# Patient Record
Sex: Female | Born: 1935 | Race: White | Hispanic: No | Smoking: Never smoker
Health system: Southern US, Community
[De-identification: ages and names within clinical notes are randomized; demographics above are authoritative.]

## PROBLEM LIST (undated history)

## (undated) DIAGNOSIS — E119 Type 2 diabetes mellitus without complications: Secondary | ICD-10-CM

## (undated) DIAGNOSIS — Z9289 Personal history of other medical treatment: Secondary | ICD-10-CM

## (undated) DIAGNOSIS — I10 Essential (primary) hypertension: Secondary | ICD-10-CM

## (undated) DIAGNOSIS — I214 Non-ST elevation (NSTEMI) myocardial infarction: Secondary | ICD-10-CM

## (undated) DIAGNOSIS — K861 Other chronic pancreatitis: Secondary | ICD-10-CM

## (undated) DIAGNOSIS — D649 Anemia, unspecified: Secondary | ICD-10-CM

## (undated) DIAGNOSIS — D0471 Carcinoma in situ of skin of right lower limb, including hip: Secondary | ICD-10-CM

## (undated) DIAGNOSIS — E785 Hyperlipidemia, unspecified: Secondary | ICD-10-CM

## (undated) DIAGNOSIS — M810 Age-related osteoporosis without current pathological fracture: Secondary | ICD-10-CM

## (undated) DIAGNOSIS — I739 Peripheral vascular disease, unspecified: Secondary | ICD-10-CM

## (undated) DIAGNOSIS — N200 Calculus of kidney: Secondary | ICD-10-CM

## (undated) DIAGNOSIS — E039 Hypothyroidism, unspecified: Secondary | ICD-10-CM

## (undated) DIAGNOSIS — I4891 Unspecified atrial fibrillation: Secondary | ICD-10-CM

## (undated) HISTORY — DX: Other chronic pancreatitis: K86.1

## (undated) HISTORY — DX: Peripheral vascular disease, unspecified: I73.9

## (undated) HISTORY — DX: Essential (primary) hypertension: I10

## (undated) HISTORY — DX: Personal history of other medical treatment: Z92.89

## (undated) HISTORY — DX: Type 2 diabetes mellitus without complications: E11.9

## (undated) HISTORY — DX: Unspecified atrial fibrillation: I48.91

## (undated) HISTORY — PX: APPENDECTOMY: SHX54

## (undated) HISTORY — PX: TOTAL HIP ARTHROPLASTY: SHX124

## (undated) HISTORY — DX: Hyperlipidemia, unspecified: E78.5

## (undated) HISTORY — DX: Hypothyroidism, unspecified: E03.9

## (undated) HISTORY — DX: Calculus of kidney: N20.0

## (undated) HISTORY — DX: Age-related osteoporosis without current pathological fracture: M81.0

## (undated) HISTORY — DX: Anemia, unspecified: D64.9

## (undated) HISTORY — PX: ABDOMINAL HYSTERECTOMY: SHX81

---

## 2001-05-23 ENCOUNTER — Encounter: Payer: Self-pay | Admitting: Specialist

## 2001-05-23 ENCOUNTER — Inpatient Hospital Stay (HOSPITAL_COMMUNITY): Admission: EM | Admit: 2001-05-23 | Discharge: 2001-05-26 | Payer: Self-pay | Admitting: Emergency Medicine

## 2001-05-23 ENCOUNTER — Encounter: Payer: Self-pay | Admitting: Emergency Medicine

## 2001-05-26 ENCOUNTER — Inpatient Hospital Stay
Admission: RE | Admit: 2001-05-26 | Discharge: 2001-06-10 | Payer: Self-pay | Admitting: Physical Medicine & Rehabilitation

## 2001-10-26 ENCOUNTER — Encounter: Payer: Self-pay | Admitting: Specialist

## 2001-10-26 ENCOUNTER — Encounter: Admission: RE | Admit: 2001-10-26 | Discharge: 2001-10-26 | Payer: Self-pay | Admitting: Specialist

## 2002-01-27 ENCOUNTER — Encounter: Admission: RE | Admit: 2002-01-27 | Discharge: 2002-01-27 | Payer: Self-pay | Admitting: Specialist

## 2002-01-27 ENCOUNTER — Encounter: Payer: Self-pay | Admitting: Specialist

## 2002-01-28 ENCOUNTER — Emergency Department (HOSPITAL_COMMUNITY): Admission: EM | Admit: 2002-01-28 | Discharge: 2002-01-28 | Payer: Self-pay | Admitting: Emergency Medicine

## 2002-02-22 ENCOUNTER — Encounter: Admission: RE | Admit: 2002-02-22 | Discharge: 2002-02-22 | Payer: Self-pay | Admitting: Urology

## 2002-02-22 ENCOUNTER — Encounter: Payer: Self-pay | Admitting: Urology

## 2004-04-23 ENCOUNTER — Ambulatory Visit (HOSPITAL_COMMUNITY): Admission: RE | Admit: 2004-04-23 | Discharge: 2004-04-23 | Payer: Self-pay | Admitting: Urology

## 2004-04-23 ENCOUNTER — Ambulatory Visit (HOSPITAL_BASED_OUTPATIENT_CLINIC_OR_DEPARTMENT_OTHER): Admission: RE | Admit: 2004-04-23 | Discharge: 2004-04-23 | Payer: Self-pay | Admitting: Urology

## 2004-12-27 ENCOUNTER — Emergency Department (HOSPITAL_COMMUNITY): Admission: EM | Admit: 2004-12-27 | Discharge: 2004-12-27 | Payer: Self-pay

## 2005-01-07 ENCOUNTER — Ambulatory Visit: Payer: Self-pay | Admitting: Cardiology

## 2005-01-08 ENCOUNTER — Emergency Department (HOSPITAL_COMMUNITY): Admission: EM | Admit: 2005-01-08 | Discharge: 2005-01-08 | Payer: Self-pay | Admitting: Emergency Medicine

## 2005-01-16 ENCOUNTER — Ambulatory Visit: Payer: Self-pay

## 2005-01-16 ENCOUNTER — Ambulatory Visit: Payer: Self-pay | Admitting: Cardiovascular Disease

## 2005-03-03 ENCOUNTER — Ambulatory Visit: Payer: Self-pay | Admitting: Cardiology

## 2005-09-08 ENCOUNTER — Ambulatory Visit: Payer: Self-pay | Admitting: Cardiology

## 2006-06-20 ENCOUNTER — Emergency Department (HOSPITAL_COMMUNITY): Admission: EM | Admit: 2006-06-20 | Discharge: 2006-06-20 | Payer: Self-pay | Admitting: Emergency Medicine

## 2007-02-06 ENCOUNTER — Inpatient Hospital Stay (HOSPITAL_COMMUNITY): Admission: EM | Admit: 2007-02-06 | Discharge: 2007-02-10 | Payer: Self-pay | Admitting: Emergency Medicine

## 2007-02-07 ENCOUNTER — Ambulatory Visit: Payer: Self-pay | Admitting: Cardiovascular Disease

## 2007-02-07 ENCOUNTER — Encounter: Payer: Self-pay | Admitting: Internal Medicine

## 2007-02-09 ENCOUNTER — Encounter: Payer: Self-pay | Admitting: Cardiology

## 2007-03-07 ENCOUNTER — Ambulatory Visit (HOSPITAL_BASED_OUTPATIENT_CLINIC_OR_DEPARTMENT_OTHER): Admission: RE | Admit: 2007-03-07 | Discharge: 2007-03-07 | Payer: Self-pay | Admitting: Urology

## 2007-10-25 ENCOUNTER — Encounter: Admission: RE | Admit: 2007-10-25 | Discharge: 2007-10-25 | Payer: Self-pay | Admitting: Internal Medicine

## 2007-12-15 DIAGNOSIS — I214 Non-ST elevation (NSTEMI) myocardial infarction: Secondary | ICD-10-CM

## 2007-12-15 HISTORY — DX: Non-ST elevation (NSTEMI) myocardial infarction: I21.4

## 2008-03-30 ENCOUNTER — Emergency Department (HOSPITAL_COMMUNITY): Admission: EM | Admit: 2008-03-30 | Discharge: 2008-03-31 | Payer: Self-pay | Admitting: Emergency Medicine

## 2008-10-23 ENCOUNTER — Ambulatory Visit: Payer: Self-pay | Admitting: Vascular Surgery

## 2008-11-12 ENCOUNTER — Ambulatory Visit: Payer: Self-pay | Admitting: Cardiology

## 2008-11-13 ENCOUNTER — Inpatient Hospital Stay (HOSPITAL_COMMUNITY): Admission: EM | Admit: 2008-11-13 | Discharge: 2008-11-16 | Payer: Self-pay | Admitting: Emergency Medicine

## 2008-11-14 ENCOUNTER — Encounter: Payer: Self-pay | Admitting: Cardiovascular Disease

## 2008-11-28 ENCOUNTER — Ambulatory Visit: Payer: Self-pay | Admitting: Cardiology

## 2008-12-07 ENCOUNTER — Observation Stay (HOSPITAL_COMMUNITY): Admission: EM | Admit: 2008-12-07 | Discharge: 2008-12-07 | Payer: Self-pay | Admitting: Emergency Medicine

## 2009-01-02 ENCOUNTER — Ambulatory Visit: Payer: Self-pay | Admitting: Cardiology

## 2009-05-13 ENCOUNTER — Inpatient Hospital Stay (HOSPITAL_COMMUNITY): Admission: EM | Admit: 2009-05-13 | Discharge: 2009-05-16 | Payer: Self-pay | Admitting: Emergency Medicine

## 2009-05-30 ENCOUNTER — Ambulatory Visit: Payer: Self-pay | Admitting: *Deleted

## 2009-05-30 ENCOUNTER — Observation Stay (HOSPITAL_COMMUNITY): Admission: EM | Admit: 2009-05-30 | Discharge: 2009-06-01 | Payer: Self-pay | Admitting: Emergency Medicine

## 2009-06-13 ENCOUNTER — Ambulatory Visit: Payer: Self-pay | Admitting: Cardiovascular Disease

## 2009-06-14 ENCOUNTER — Observation Stay (HOSPITAL_COMMUNITY): Admission: EM | Admit: 2009-06-14 | Discharge: 2009-06-15 | Payer: Self-pay | Admitting: Emergency Medicine

## 2009-07-01 ENCOUNTER — Observation Stay (HOSPITAL_COMMUNITY): Admission: EM | Admit: 2009-07-01 | Discharge: 2009-07-02 | Payer: Self-pay | Admitting: Emergency Medicine

## 2009-07-01 ENCOUNTER — Ambulatory Visit: Payer: Self-pay | Admitting: Internal Medicine

## 2009-07-03 ENCOUNTER — Telehealth: Payer: Self-pay | Admitting: Cardiology

## 2009-07-05 ENCOUNTER — Encounter: Payer: Self-pay | Admitting: Cardiology

## 2009-07-05 ENCOUNTER — Telehealth: Payer: Self-pay | Admitting: Cardiology

## 2009-07-06 DIAGNOSIS — E119 Type 2 diabetes mellitus without complications: Secondary | ICD-10-CM

## 2009-07-06 DIAGNOSIS — I2510 Atherosclerotic heart disease of native coronary artery without angina pectoris: Secondary | ICD-10-CM | POA: Insufficient documentation

## 2009-07-06 DIAGNOSIS — I739 Peripheral vascular disease, unspecified: Secondary | ICD-10-CM

## 2009-07-06 DIAGNOSIS — M810 Age-related osteoporosis without current pathological fracture: Secondary | ICD-10-CM | POA: Insufficient documentation

## 2009-07-06 DIAGNOSIS — E039 Hypothyroidism, unspecified: Secondary | ICD-10-CM | POA: Insufficient documentation

## 2009-07-06 DIAGNOSIS — Z8679 Personal history of other diseases of the circulatory system: Secondary | ICD-10-CM | POA: Insufficient documentation

## 2009-07-06 DIAGNOSIS — K861 Other chronic pancreatitis: Secondary | ICD-10-CM

## 2009-07-06 DIAGNOSIS — I10 Essential (primary) hypertension: Secondary | ICD-10-CM | POA: Insufficient documentation

## 2009-07-06 DIAGNOSIS — E785 Hyperlipidemia, unspecified: Secondary | ICD-10-CM | POA: Insufficient documentation

## 2009-07-06 DIAGNOSIS — D539 Nutritional anemia, unspecified: Secondary | ICD-10-CM | POA: Insufficient documentation

## 2009-07-06 DIAGNOSIS — N200 Calculus of kidney: Secondary | ICD-10-CM | POA: Insufficient documentation

## 2009-07-10 ENCOUNTER — Ambulatory Visit: Payer: Self-pay | Admitting: Cardiology

## 2009-07-10 ENCOUNTER — Encounter (INDEPENDENT_AMBULATORY_CARE_PROVIDER_SITE_OTHER): Payer: Self-pay | Admitting: *Deleted

## 2009-10-02 ENCOUNTER — Ambulatory Visit: Payer: Self-pay | Admitting: Cardiology

## 2009-10-02 DIAGNOSIS — I252 Old myocardial infarction: Secondary | ICD-10-CM

## 2009-10-11 ENCOUNTER — Encounter (INDEPENDENT_AMBULATORY_CARE_PROVIDER_SITE_OTHER): Payer: Self-pay | Admitting: *Deleted

## 2009-11-15 ENCOUNTER — Telehealth: Payer: Self-pay | Admitting: Cardiology

## 2009-11-15 ENCOUNTER — Ambulatory Visit: Payer: Self-pay | Admitting: Cardiovascular Disease

## 2009-11-15 DIAGNOSIS — R002 Palpitations: Secondary | ICD-10-CM | POA: Insufficient documentation

## 2009-12-06 IMAGING — CR DG CHEST 2V
2 series · 2 of 2 positions shown · non-contrast
Comparison: 05/30/2009

CLINICAL DATA: Chest pain, hypertension

CHEST - 2 VIEW

[w chest pa]
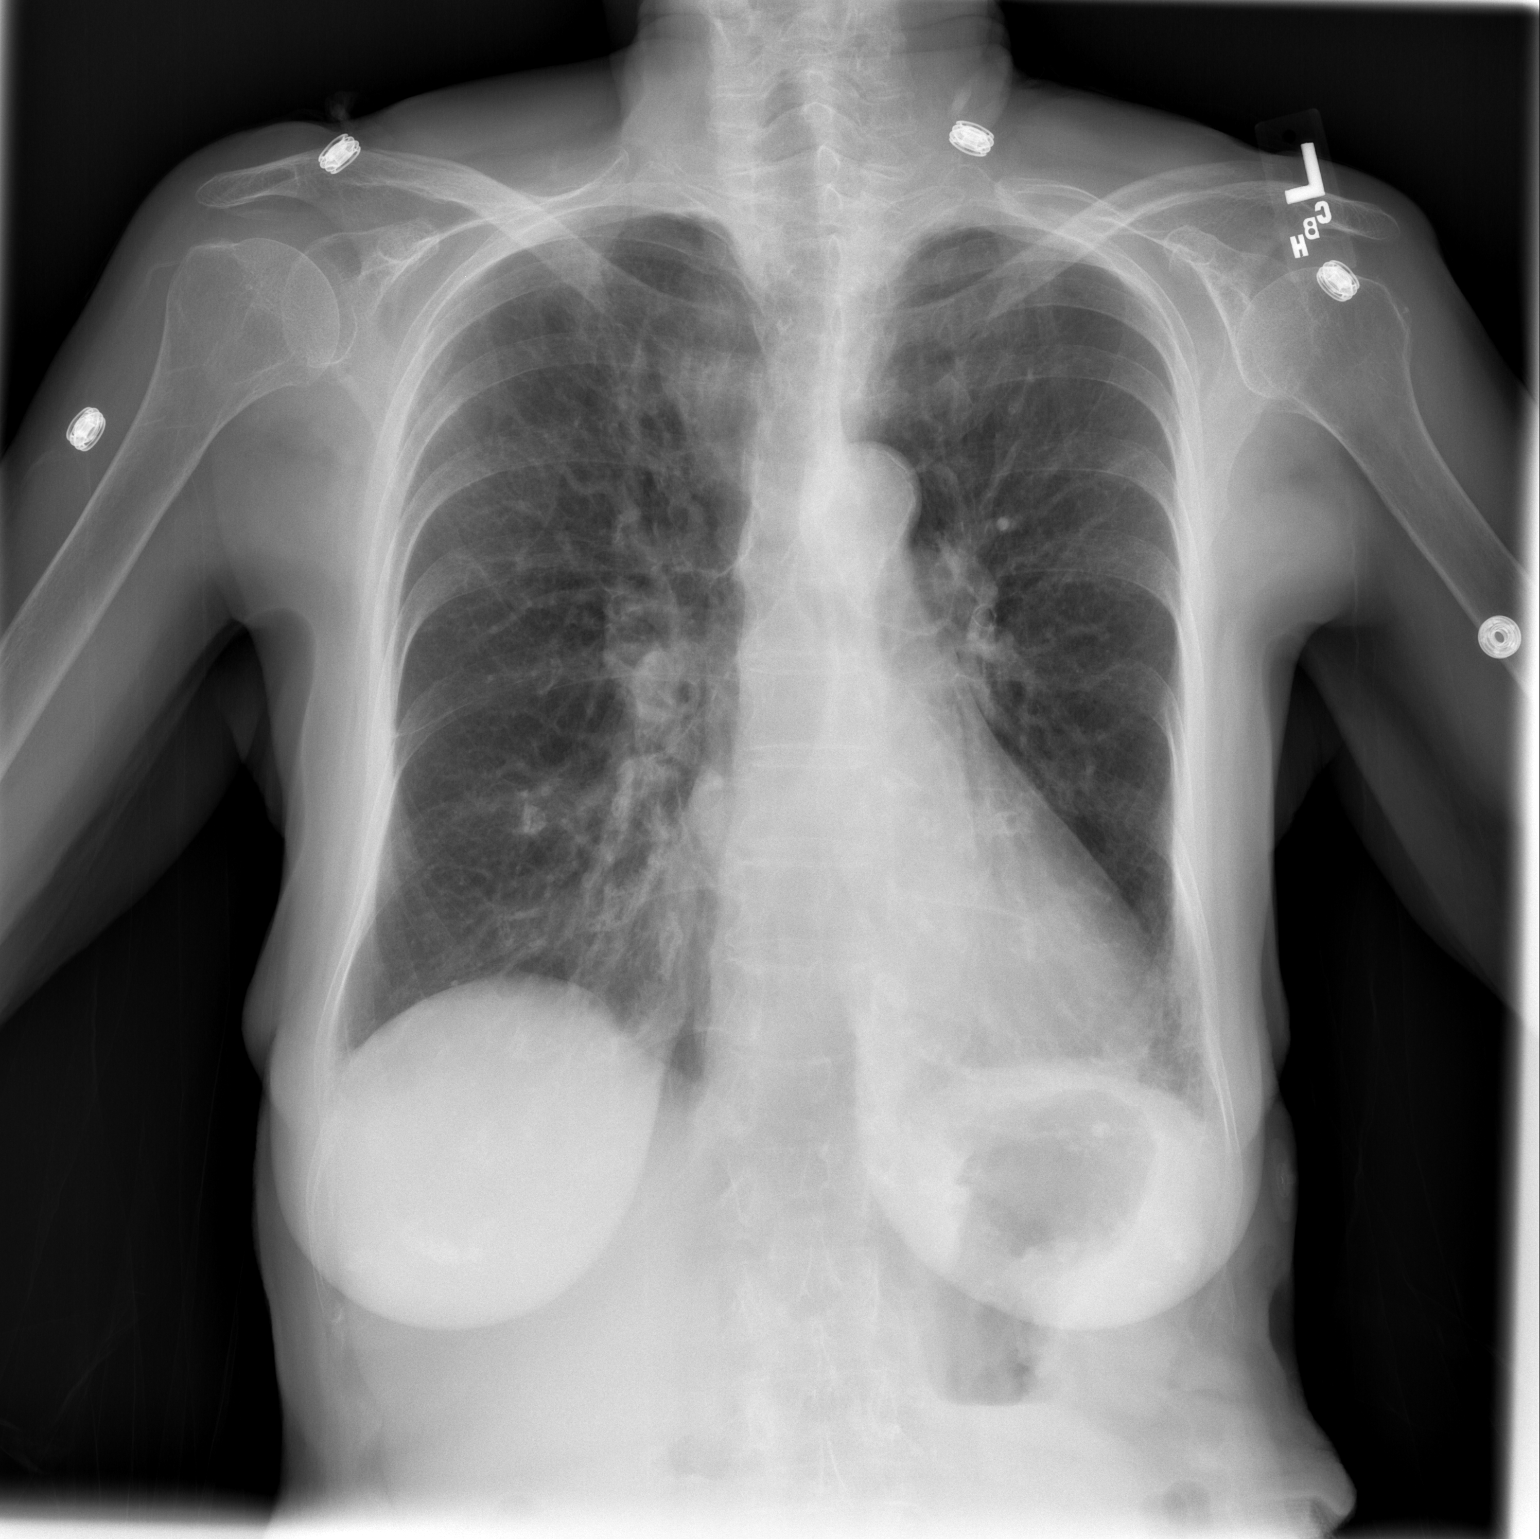

[w chest lat]
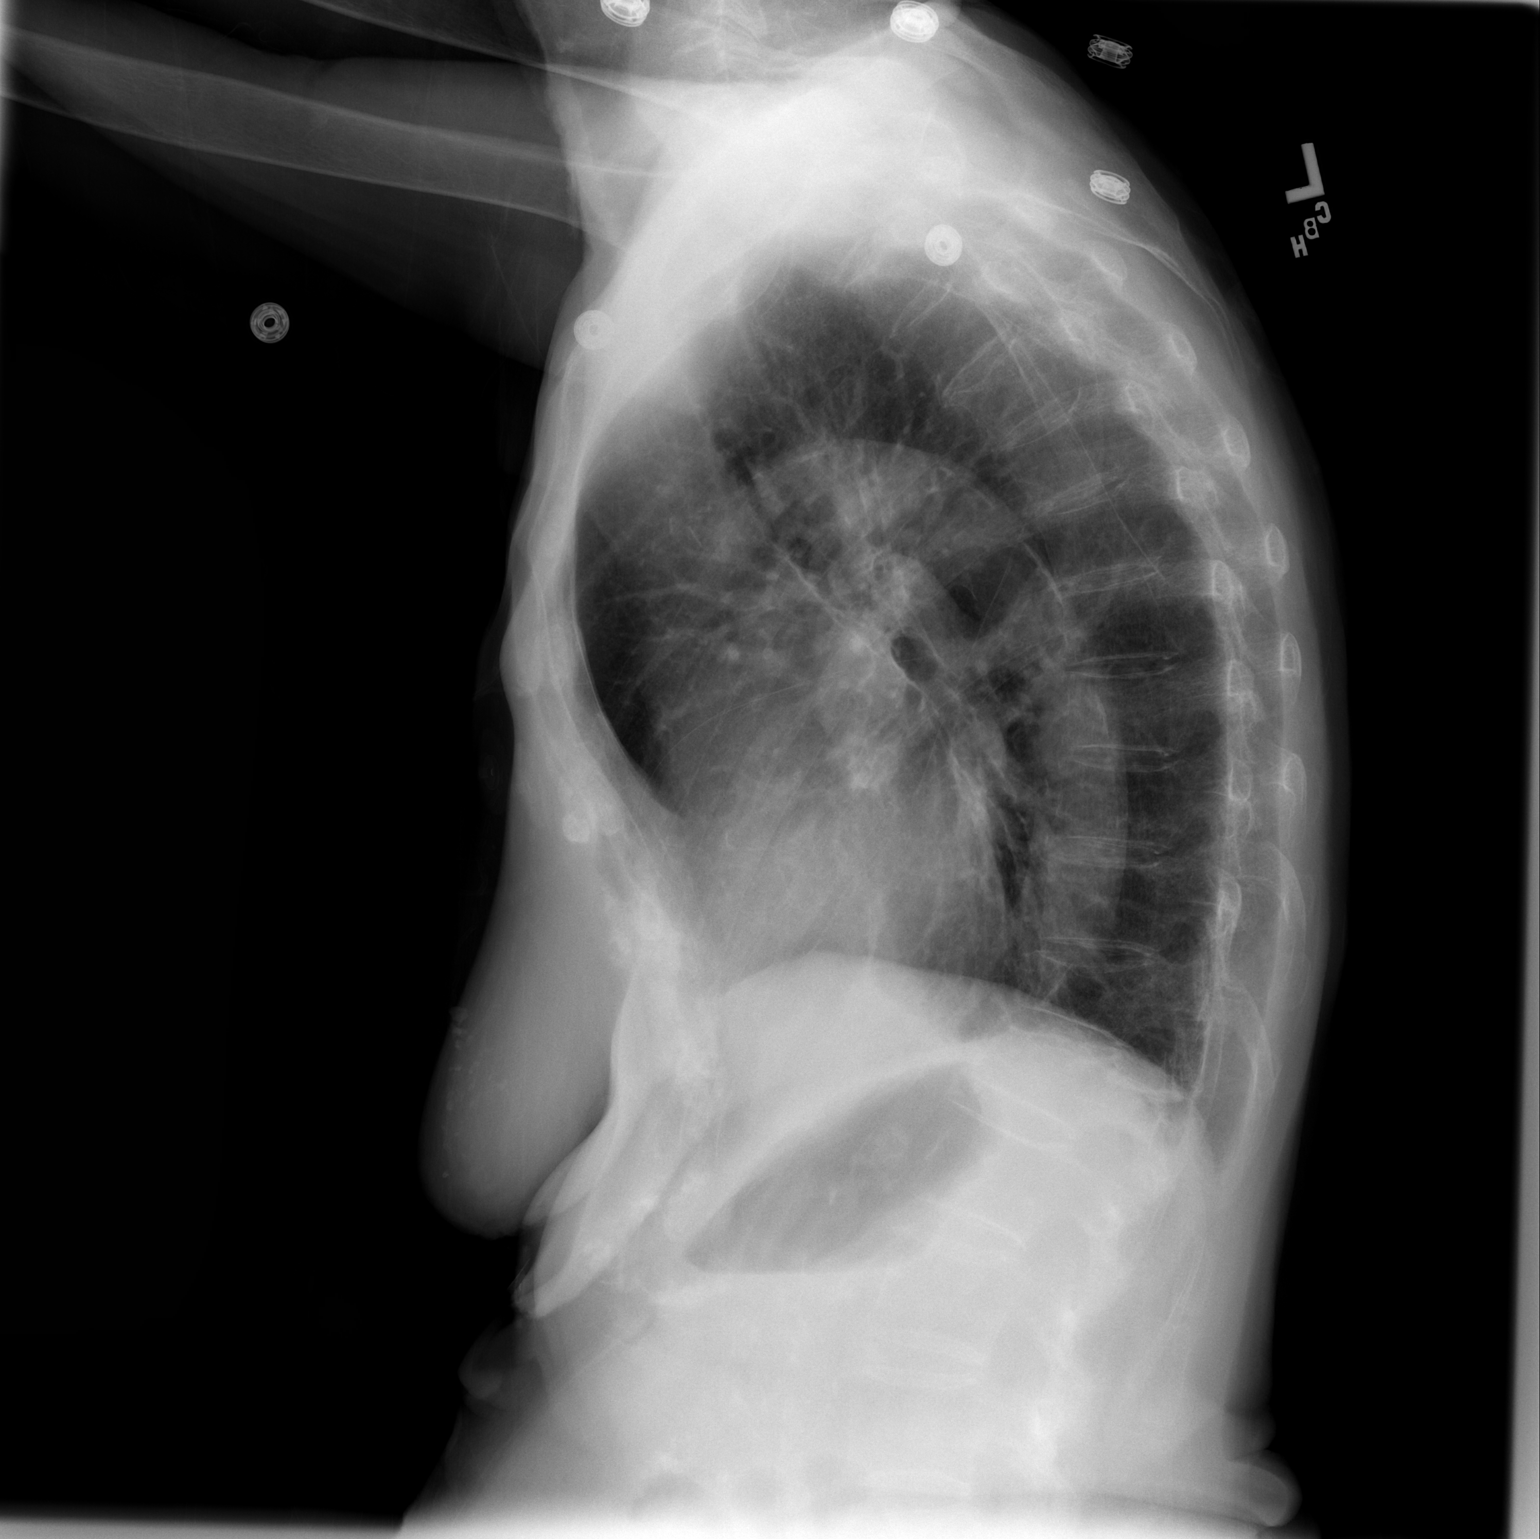

[2 of 2 positions shown; findings below may reference images not displayed]

FINDINGS: Mild cardiomegaly stable.  Lungs hyperinflated with
coarse perihilar interstitial markings.  No focal infiltrate.  No
overt edema.  No effusion.  Lower thoracic spine compression
deformities stable.
IMPRESSION: 1.  Stable mild cardiomegaly.
2.  Hyperinflation without acute or superimposed abnormality.

## 2009-12-24 IMAGING — CR DG CHEST 2V
2 series · 2 of 2 positions shown · non-contrast
Comparison: 06/13/2009

CLINICAL DATA: Chest pain.

CHEST - 2 VIEW

[w chest pa]
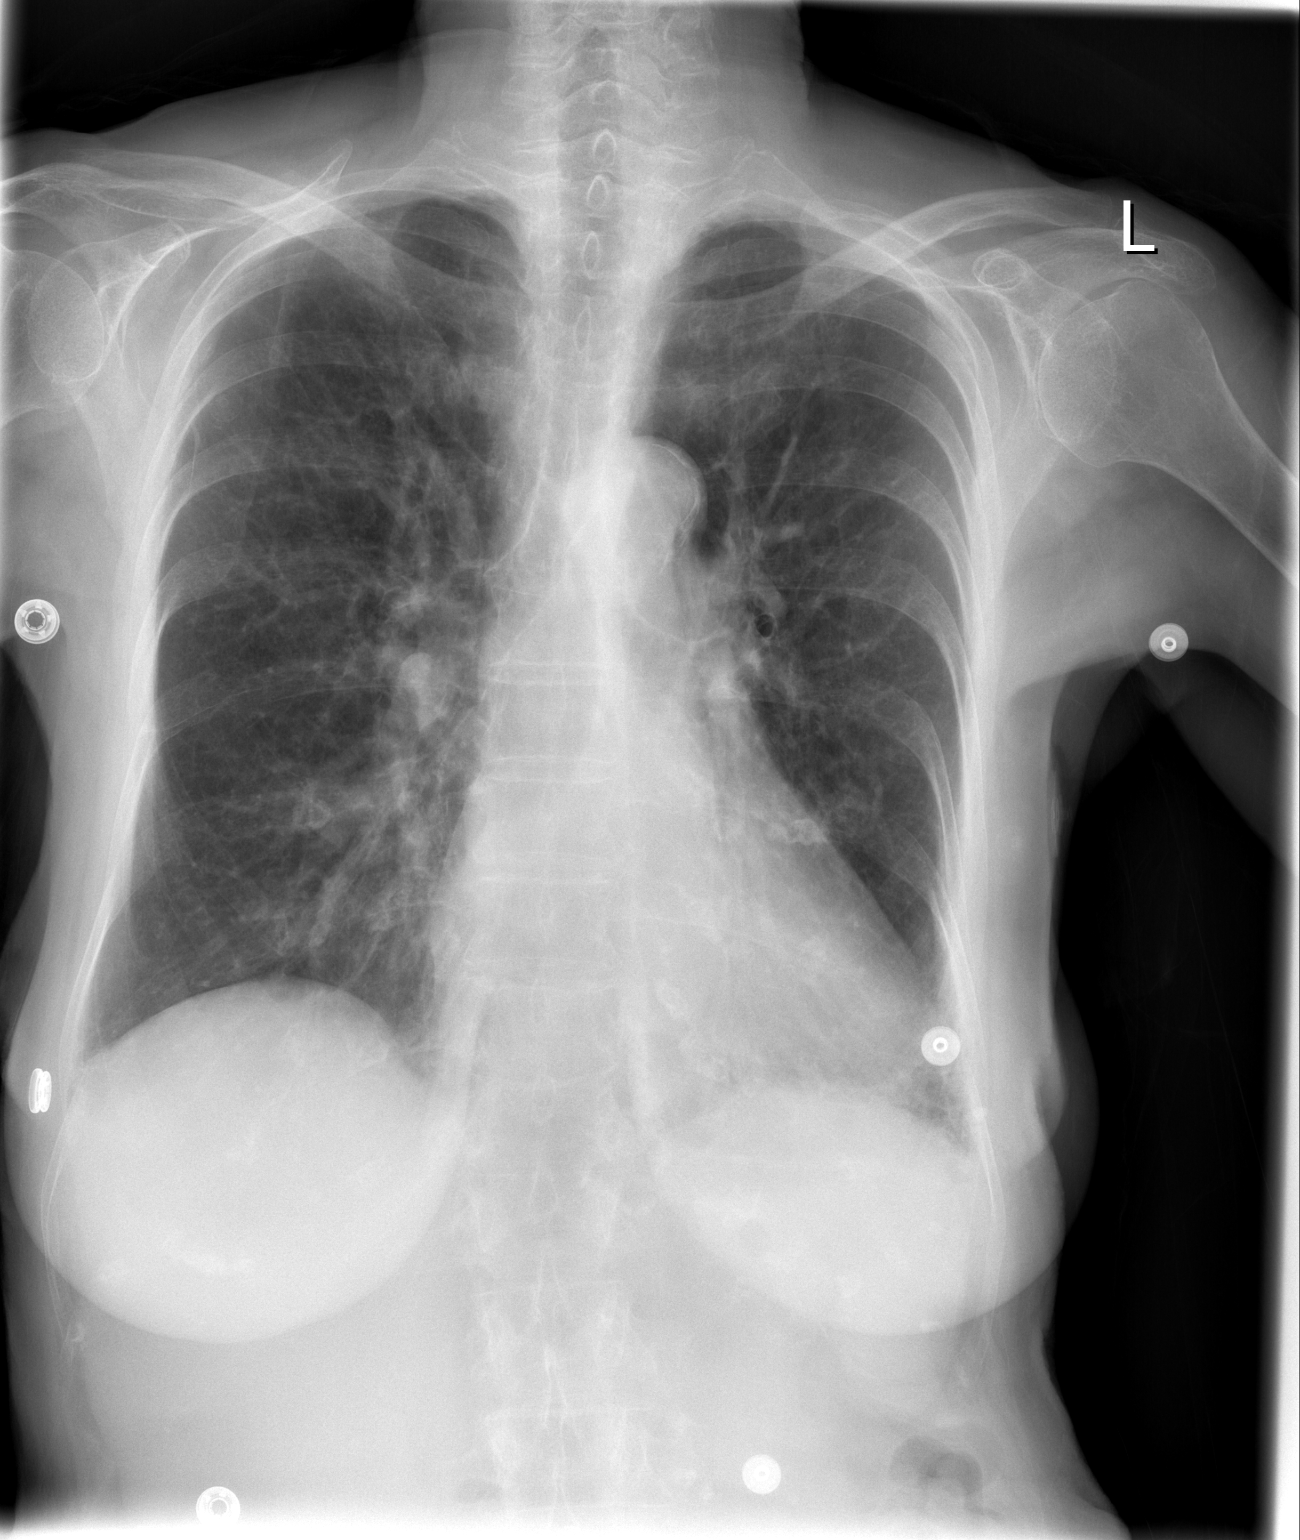

[w chest lat]
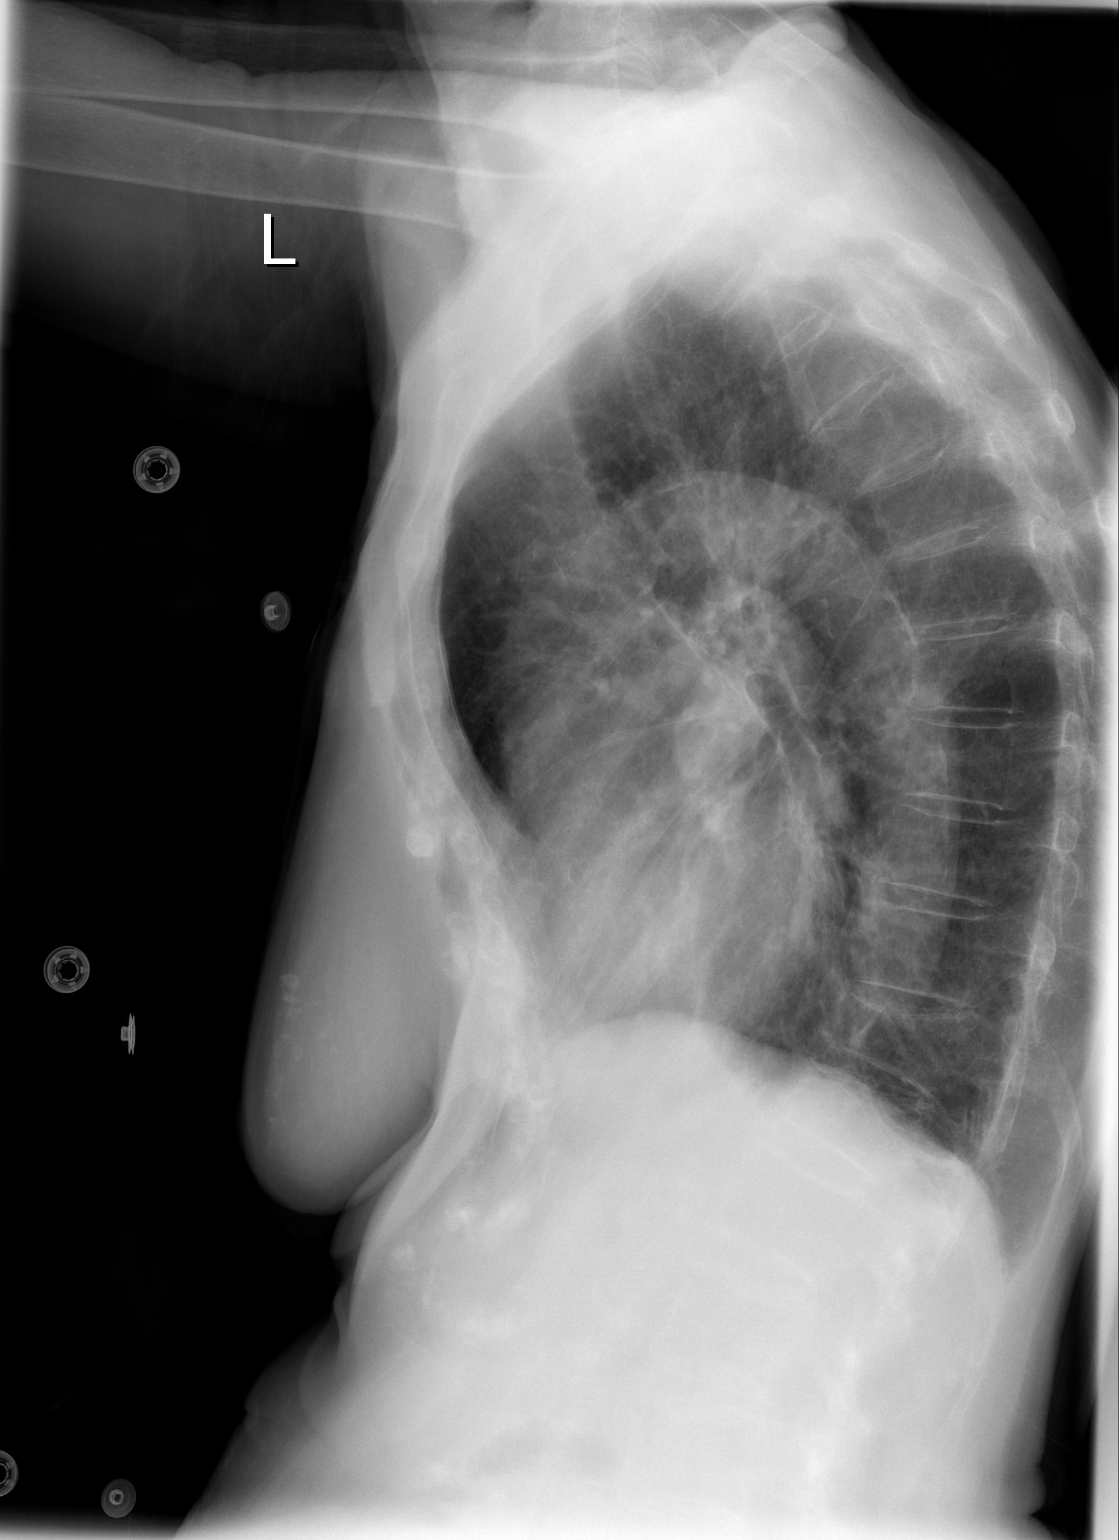

[2 of 2 positions shown; findings below may reference images not displayed]

FINDINGS: There is moderate pectus excavatum deformity.  No change
in lower thoracic compression deformities.  Moderate osteopenia.
Midline trachea. Mild cardiomegaly.  Calcified transverse aorta. No
pleural effusion or pneumothorax. There is mild linear opacity the
left lung base the frontal view.  This is felt to be similar over
prior exams, likely related to scar.  Not definitely identified on
the lateral view.  Diffuse interstitial thickening.
IMPRESSION: 1. No acute cardiopulmonary disease.
2.  Cardiomegaly without congestive failure.

## 2010-05-17 ENCOUNTER — Inpatient Hospital Stay (HOSPITAL_COMMUNITY): Admission: EM | Admit: 2010-05-17 | Discharge: 2010-05-21 | Payer: Self-pay | Admitting: Emergency Medicine

## 2010-09-24 ENCOUNTER — Ambulatory Visit: Payer: Self-pay | Admitting: Cardiology

## 2010-11-25 ENCOUNTER — Emergency Department (HOSPITAL_COMMUNITY)
Admission: EM | Admit: 2010-11-25 | Discharge: 2010-11-25 | Payer: Self-pay | Source: Home / Self Care | Admitting: Emergency Medicine

## 2010-11-25 ENCOUNTER — Telehealth: Payer: Self-pay | Admitting: Cardiology

## 2011-01-13 NOTE — Assessment & Plan Note (Signed)
Summary: per check out/sf   Visit Type:  1 yr f/u Primary [name]:  Dr. Darcus Austin  CC:  pt had some rehab for some recent back problems otherwise she offers no cardiac complaints today.  History of Present Illness: [name] comes in today for evaluation and management of coronary disease.  She's having no symptoms of angina ischemia. She denies any syncope or presyncope or orthostatic symptoms. He's had no palpitations.  She has a little bit of edema but response to raising her legs. She remains on 10 mg of amlodipine.  Current Medications (verified): 1)  Aspir-Low 81 Mg Tbec (Aspirin) .... 2-3 Times Weekly 2)  Amlodipine Besylate 10 Mg Tabs (Amlodipine Besylate) .... Take One Daily 3)  Atenolol 25 Mg Tabs (Atenolol) .... Take One and One-Half Tablet  Two Times A Day 4)  Gemfibrozil 600 Mg Tabs (Gemfibrozil) .... Take One Two Times A Day 5)  Imdur 30 Mg Xr24h-Tab (Isosorbide Mononitrate) .... Take One Two Times A Day 6)  Metformin Hcl 500 Mg Tabs (Metformin Hcl) .... Take One Two Times A Day 7)  Synthroid 112 Mcg Tabs (Levothyroxine Sodium) .... Take One Daily 8)  Lantus 100 Unit/ml Soln (Insulin Glargine) .Marland Kitchen.. 16 Units Once Daily 9)  Januvia 50 Mg Tabs (Sitagliptin Phosphate) .... Take One Daily 10)  Lasix 20 Mg Tabs (Furosemide) .... Take One Half Daily 11)  Potassium Chloride Cr 10 Meq Cr-Caps (Potassium Chloride) .... Take Two Per Week 12)  Nitrostat 0.4 Mg Subl (Nitroglycerin) .Marland Kitchen.. 1 Tablet Under Tongue At Onset of Chest Pain; You May Repeat Every 5 Minutes For Up To 3 Doses. 13)  Protonix 40 Mg Tbec (Pantoprazole Sodium) .Marland Kitchen.. 1 Tab Two Times A Day  Allergies: 1)  ! Bactrim Ds (Sulfamethoxazole-Trimethoprim) 2)  ! Penicillin  Past History:  Past Medical History: Last updated: 07/09/2009 Current Problems:  CAD (ICD-414.00)- post myocardial infarction in 2009.  HYPERLIPIDEMIA (ICD-272.4) HYPERTENSION (ICD-401.9) PERIPHERAL VASCULAR DISEASE (ICD-443.9) PALPITATIONS,  HX OF (ICD-V12.50) ANEMIA, CHRONIC (ICD-281.9) PANCREATITIS, CHRONIC (ICD-577.1) DIABETES MELLITUS, TYPE II (ICD-250.00) HYPOTHYROIDISM (ICD-244.9) OSTEOPOROSIS (ICD-733.00) NEPHROLITHIASIS (ICD-592.0)  Past Surgical History: Last updated: 07/09/2009  hip replacement in 2002  appendectomy   hysterectomy.   Family History: Last updated: 07/09/2009   Positive for coronary disease and diabetes.   Social History: Last updated: 07/09/2009  She lives in [place] alone.  She is currently   widowed.  She is retired.  She does not smoke.      Review of Systems       negative other than history of present illness  Vital Signs:  Patient profile:   77 year old female Height:      61 inches Weight:      101.8 pounds BMI:     19.30 Pulse rate:   62 / minute Pulse rhythm:   irregular BP sitting:   132 / 52  (left arm) Cuff size:   large  Vitals Entered By: [name], CMA (September 24, 2010 12:13 PM)  Physical Exam  General:  Well developed, well nourished, in no acute distress. Head:  normocephalic and atraumatic Eyes:  glasses otherwise normal Neck:  Neck supple, no JVD. No masses, thyromegaly or abnormal cervical nodes. Lungs:  few crackles in the right base that clear with coughing. Heart:  PMI nondisplaced, regular rate and rhythm, normal S1-S2, no murmur gallop, carotid upstrokes equal bilaterally without bruits Msk:  decreased ROM.   Pulses:  pulses normal in all 4 extremities Extremities:  No clubbing or cyanosis.trace left  pedal edema and trace right pedal edema.   Neurologic:  Alert and oriented x 3. Skin:  Intact without lesions or rashes. Psych:  Normal affect.   EKG  Procedure date:  09/24/2010  Findings:      normal sinus rhythm, poor R wave progression, no acute changes.  Impression & Recommendations:  Problem # 1:  CAD (ICD-414.00) Assessment Unchanged continue medical therapy Her updated medication list for this problem includes:    Aspir-low  81 Mg Tbec (Aspirin) .Marland Kitchen... 2-3 times weekly    Amlodipine Besylate 10 Mg Tabs (Amlodipine besylate) .Marland Kitchen... Take one daily    Atenolol 25 Mg Tabs (Atenolol) .Marland Kitchen... Take one and one-half tablet  two times a day    Imdur 30 Mg Xr24h-tab (Isosorbide mononitrate) .Marland Kitchen... Take one two times a day    Nitrostat 0.4 Mg Subl (Nitroglycerin) .Marland Kitchen... 1 tablet under tongue at onset of chest pain; you may repeat every 5 minutes for up to 3 doses.  Orders: EKG w/ Interpretation (93000)  Problem # 2:  OLD MYOCARDIAL INFARCTION (ICD-412) Assessment: Unchanged  Her updated medication list for this problem includes:    Aspir-low 81 Mg Tbec (Aspirin) .Marland Kitchen... 2-3 times weekly    Amlodipine Besylate 10 Mg Tabs (Amlodipine besylate) .Marland Kitchen... Take one daily    Atenolol 25 Mg Tabs (Atenolol) .Marland Kitchen... Take one and one-half tablet  two times a day    Imdur 30 Mg Xr24h-tab (Isosorbide mononitrate) .Marland Kitchen... Take one two times a day    Nitrostat 0.4 Mg Subl (Nitroglycerin) .Marland Kitchen... 1 tablet under tongue at onset of chest pain; you may repeat every 5 minutes for up to 3 doses.  Orders: EKG w/ Interpretation (93000)  Problem # 3:  PALPITATIONS (ICD-785.1)  Her updated medication list for this problem includes:    Aspir-low 81 Mg Tbec (Aspirin) .Marland Kitchen... 2-3 times weekly    Amlodipine Besylate 10 Mg Tabs (Amlodipine besylate) .Marland Kitchen... Take one daily    Atenolol 25 Mg Tabs (Atenolol) .Marland Kitchen... Take one and one-half tablet  two times a day    Imdur 30 Mg Xr24h-tab (Isosorbide mononitrate) .Marland Kitchen... Take one two times a day    Nitrostat 0.4 Mg Subl (Nitroglycerin) .Marland Kitchen... 1 tablet under tongue at onset of chest pain; you may repeat every 5 minutes for up to 3 doses.  Problem # 4:  HYPERTENSION (ICD-401.9)  Her updated medication list for this problem includes:    Aspir-low 81 Mg Tbec (Aspirin) .Marland Kitchen... 2-3 times weekly    Amlodipine Besylate 10 Mg Tabs (Amlodipine besylate) .Marland Kitchen... Take one daily    Atenolol 25 Mg Tabs (Atenolol) .Marland Kitchen... Take one and one-half  tablet  two times a day    Lasix 20 Mg Tabs (Furosemide) .Marland Kitchen... Take one half daily  Patient Instructions: 1)  Your physician recommends that you schedule a follow-up appointment in: 1 year with Dr. Daleen Squibb 2)  Your physician recommends that you continue on your current medications as directed. Please refer to the Current Medication list given to you today.

## 2011-01-15 NOTE — Progress Notes (Signed)
Summary: pain under left shoulder blade  Phone Note Call from Patient Call back at Home Phone [phone]   Caller: Caregiver/ Pat Summary of Call: Pain under left shoulder blade running around to front ,pt has taken two Nitro pills Initial call taken by: Judie Grieve,  November 25, 2010 9:30 AM  Follow-up for Phone Call        pat was  advised to go and take pt to ER. pat states pt is weak/sob/cp. Roe Coombs, November 25, 2010 9:53AM spoke with Dennie Bible the caregiver.  Pt took NTG at 8:10 am got some relief w/ 1  Took another NTG at 9:25 am got no relief  Her pain is at a "10"  She feels like she can't get a breath.  I advised Dennie Bible to call 911 and get her the the ER to be evaluated.  She is going to do so. Dennis Bast, RN, BSN  November 25, 2010 10:04 AM     Appended Document: pain under left shoulder blade  Reviewed Juanito Doom, MD

## 2011-02-03 ENCOUNTER — Ambulatory Visit
Admission: RE | Admit: 2011-02-03 | Discharge: 2011-02-03 | Disposition: A | Discharge status: Home / Self Care | Payer: Medicare Other | Source: Ambulatory Visit | Attending: Internal Medicine | Admitting: Internal Medicine

## 2011-02-03 ENCOUNTER — Other Ambulatory Visit: Payer: Self-pay | Admitting: Internal Medicine

## 2011-02-03 DIAGNOSIS — J9811 Atelectasis: Secondary | ICD-10-CM

## 2011-02-23 LAB — COMPREHENSIVE METABOLIC PANEL
ALT: 13 U/L (ref 0–35)
AST: 22 U/L (ref 0–37)
Albumin: 3.4 g/dL — ABNORMAL LOW (ref 3.5–5.2)
Alkaline Phosphatase: 80 U/L (ref 39–117)
Calcium: 9.2 mg/dL (ref 8.4–10.5)
GFR calc Af Amer: >60 mL/min (ref >60)
Potassium: 3.8 mEq/L (ref 3.5–5.1)
Sodium: 129 mEq/L — ABNORMAL LOW (ref 135–145)
Total Protein: 7.8 g/dL (ref 6.0–8.3)

## 2011-02-23 LAB — CBC
HCT: 35.9 % — ABNORMAL LOW (ref 36.0–46.0)
Hemoglobin: 11.9 g/dL — ABNORMAL LOW (ref 12.0–15.0)
MCHC: 33.1 g/dL (ref 30.0–36.0)
RDW: 13.6 % (ref 11.5–15.5)
WBC: 8.8 10*3/uL (ref 4.0–10.5)

## 2011-02-23 LAB — URINALYSIS, ROUTINE W REFLEX MICROSCOPIC
Glucose, UA: 500 mg/dL — AB
Ketones, ur: NEGATIVE mg/dL
Nitrite: NEGATIVE
Protein, ur: NEGATIVE mg/dL
pH: 6 (ref 5.0–8.0)

## 2011-02-23 LAB — POCT CARDIAC MARKERS
CKMB, poc: 1.3 ng/mL (ref 1.0–8.0)
CKMB, poc: 1.5 ng/mL (ref 1.0–8.0)
Myoglobin, poc: 104 ng/mL (ref 12–200)
Troponin i, poc: <0.05 ng/mL (ref 0.00–0.09)

## 2011-02-23 LAB — DIFFERENTIAL
Basophils Relative: 1 % (ref 0–1)
Eosinophils Relative: 1 % (ref 0–5)
Lymphs Abs: 1.1 10*3/uL (ref 0.7–4.0)
Monocytes Absolute: 0.4 10*3/uL (ref 0.1–1.0)
Monocytes Relative: 5 % (ref 3–12)
Neutro Abs: 7.1 10*3/uL (ref 1.7–7.7)

## 2011-02-23 LAB — D-DIMER, QUANTITATIVE: D-Dimer, Quant: 0.52 ug/mL-FEU — ABNORMAL HIGH (ref 0.00–0.48)

## 2011-03-02 LAB — URINALYSIS, ROUTINE W REFLEX MICROSCOPIC
Bilirubin Urine: NEGATIVE
Glucose, UA: NEGATIVE mg/dL
Hgb urine dipstick: NEGATIVE
Ketones, ur: NEGATIVE mg/dL
Nitrite: POSITIVE — AB
Protein, ur: NEGATIVE mg/dL
Specific Gravity, Urine: 1.012 (ref 1.005–1.030)
Urobilinogen, UA: 0.2 mg/dL (ref 0.0–1.0)
pH: 6.5 (ref 5.0–8.0)

## 2011-03-02 LAB — GLUCOSE, CAPILLARY
Glucose-Capillary: 119 mg/dL — ABNORMAL HIGH (ref 70–99)
Glucose-Capillary: 124 mg/dL — ABNORMAL HIGH (ref 70–99)
Glucose-Capillary: 164 mg/dL — ABNORMAL HIGH (ref 70–99)
Glucose-Capillary: 177 mg/dL — ABNORMAL HIGH (ref 70–99)
Glucose-Capillary: 184 mg/dL — ABNORMAL HIGH (ref 70–99)
Glucose-Capillary: 274 mg/dL — ABNORMAL HIGH (ref 70–99)
Glucose-Capillary: 280 mg/dL — ABNORMAL HIGH (ref 70–99)
Glucose-Capillary: 367 mg/dL — ABNORMAL HIGH (ref 70–99)

## 2011-03-02 LAB — URINE CULTURE
Colony Count: >=100000
Special Requests: POSITIVE

## 2011-03-02 LAB — BASIC METABOLIC PANEL
BUN: 18 mg/dL (ref 6–23)
Chloride: 98 mEq/L (ref 96–112)
Creatinine, Ser: 0.82 mg/dL (ref 0.4–1.2)
GFR calc Af Amer: >60 mL/min (ref >60)
Glucose, Bld: 143 mg/dL — ABNORMAL HIGH (ref 70–99)
Sodium: 135 mEq/L (ref 135–145)

## 2011-03-02 LAB — COMPREHENSIVE METABOLIC PANEL
ALT: 9 U/L (ref 0–35)
AST: 16 U/L (ref 0–37)
Alkaline Phosphatase: 88 U/L (ref 39–117)
CO2: 24 mEq/L (ref 19–32)
Chloride: 104 mEq/L (ref 96–112)
Creatinine, Ser: 0.63 mg/dL (ref 0.4–1.2)
GFR calc Af Amer: >60 mL/min (ref >60)
GFR calc non Af Amer: >60 mL/min (ref >60)
Sodium: 136 mEq/L (ref 135–145)
Total Bilirubin: 0.6 mg/dL (ref 0.3–1.2)

## 2011-03-02 LAB — CBC
MCV: 97.1 fL (ref 78.0–100.0)
MCV: 98 fL (ref 78.0–100.0)
Platelets: 92 10*3/uL — ABNORMAL LOW (ref 150–400)
RBC: 3.01 MIL/uL — ABNORMAL LOW (ref 3.87–5.11)
RDW: 13.6 % (ref 11.5–15.5)
WBC: 11 10*3/uL — ABNORMAL HIGH (ref 4.0–10.5)
WBC: 7.8 10*3/uL (ref 4.0–10.5)

## 2011-03-02 LAB — DIFFERENTIAL
Basophils Absolute: 0 10*3/uL (ref 0.0–0.1)
Basophils Absolute: 0.1 K/uL (ref 0.0–0.1)
Basophils Relative: 1 % (ref 0–1)
Eosinophils Absolute: 0.1 K/uL (ref 0.0–0.7)
Eosinophils Absolute: 0.2 10*3/uL (ref 0.0–0.7)
Eosinophils Relative: 1 % (ref 0–5)
Lymphocytes Relative: 13 % (ref 12–46)
Lymphs Abs: 1.4 K/uL (ref 0.7–4.0)
Lymphs Abs: 1.6 10*3/uL (ref 0.7–4.0)
Monocytes Absolute: 0.8 10*3/uL (ref 0.1–1.0)
Monocytes Absolute: 0.9 K/uL (ref 0.1–1.0)
Monocytes Relative: 8 % (ref 3–12)
Neutro Abs: 8.5 K/uL — ABNORMAL HIGH (ref 1.7–7.7)
Neutrophils Relative %: 77 % (ref 43–77)

## 2011-03-02 LAB — D-DIMER, QUANTITATIVE: D-Dimer, Quant: 1.01 ug/mL-FEU — ABNORMAL HIGH (ref 0.00–0.48)

## 2011-03-02 LAB — POCT CARDIAC MARKERS
CKMB, poc: 2.2 ng/mL (ref 1.0–8.0)
Myoglobin, poc: 133 ng/mL (ref 12–200)
Troponin i, poc: <0.05 ng/mL (ref 0.00–0.09)

## 2011-03-02 LAB — URINE MICROSCOPIC-ADD ON

## 2011-03-22 LAB — DIFFERENTIAL
Basophils Absolute: 0 10*3/uL (ref 0.0–0.1)
Basophils Absolute: 0 10*3/uL (ref 0.0–0.1)
Basophils Relative: 0 % (ref 0–1)
Eosinophils Absolute: 0.4 10*3/uL (ref 0.0–0.7)
Eosinophils Relative: 2 % (ref 0–5)
Eosinophils Relative: 4 % (ref 0–5)
Lymphocytes Relative: 21 % (ref 12–46)
Lymphocytes Relative: 29 % (ref 12–46)
Lymphs Abs: 2.6 10*3/uL (ref 0.7–4.0)
Monocytes Absolute: 0.9 10*3/uL (ref 0.1–1.0)
Monocytes Relative: 10 % (ref 3–12)
Neutro Abs: 5.1 10*3/uL (ref 1.7–7.7)
Neutro Abs: 5.1 10*3/uL (ref 1.7–7.7)
Neutrophils Relative %: 57 % (ref 43–77)

## 2011-03-22 LAB — GLUCOSE, CAPILLARY
Glucose-Capillary: 103 mg/dL — ABNORMAL HIGH (ref 70–99)
Glucose-Capillary: 109 mg/dL — ABNORMAL HIGH (ref 70–99)
Glucose-Capillary: 125 mg/dL — ABNORMAL HIGH (ref 70–99)
Glucose-Capillary: 126 mg/dL — ABNORMAL HIGH (ref 70–99)
Glucose-Capillary: 129 mg/dL — ABNORMAL HIGH (ref 70–99)
Glucose-Capillary: 97 mg/dL (ref 70–99)
Glucose-Capillary: 128 mg/dL — ABNORMAL HIGH (ref 70–99)
Glucose-Capillary: 246 mg/dL — ABNORMAL HIGH (ref 70–99)

## 2011-03-22 LAB — CBC
HCT: 28.9 % — ABNORMAL LOW (ref 36.0–46.0)
HCT: 31.2 % — ABNORMAL LOW (ref 36.0–46.0)
HCT: 30.3 % — ABNORMAL LOW (ref 36.0–46.0)
Hemoglobin: 9.8 g/dL — ABNORMAL LOW (ref 12.0–15.0)
Hemoglobin: 10.4 g/dL — ABNORMAL LOW (ref 12.0–15.0)
MCHC: 33.9 g/dL (ref 30.0–36.0)
MCHC: 34 g/dL (ref 30.0–36.0)
MCV: 95.8 fL (ref 78.0–100.0)
Platelets: 116 10*3/uL — ABNORMAL LOW (ref 150–400)
RBC: 2.98 MIL/uL — ABNORMAL LOW (ref 3.87–5.11)
RBC: 3.26 MIL/uL — ABNORMAL LOW (ref 3.87–5.11)
RBC: 3.55 MIL/uL — ABNORMAL LOW (ref 3.87–5.11)
RDW: 13.9 % (ref 11.5–15.5)
WBC: 9 10*3/uL (ref 4.0–10.5)
WBC: 9.5 10*3/uL (ref 4.0–10.5)

## 2011-03-22 LAB — URINALYSIS, MICROSCOPIC ONLY
Glucose, UA: NEGATIVE mg/dL
Hgb urine dipstick: NEGATIVE
Specific Gravity, Urine: 1.007 (ref 1.005–1.030)
pH: 6.5 (ref 5.0–8.0)

## 2011-03-22 LAB — POCT CARDIAC MARKERS
CKMB, poc: 1 ng/mL (ref 1.0–8.0)
Myoglobin, poc: 48.4 ng/mL (ref 12–200)
Troponin i, poc: <0.05 ng/mL (ref 0.00–0.09)
Troponin i, poc: <0.05 ng/mL (ref 0.00–0.09)

## 2011-03-22 LAB — CARDIAC PANEL(CRET KIN+CKTOT+MB+TROPI)
CK, MB: 1.2 ng/mL (ref 0.3–4.0)
Relative Index: INVALID (ref 0.0–2.5)
Relative Index: INVALID (ref 0.0–2.5)
Relative Index: INVALID (ref 0.0–2.5)
Total CK: 20 U/L (ref 7–177)
Total CK: 22 U/L (ref 7–177)
Total CK: 24 U/L (ref 7–177)
Total CK: 19 U/L (ref 7–177)
Troponin I: 0.02 ng/mL (ref 0.00–0.06)
Troponin I: 0.03 ng/mL (ref 0.00–0.06)
Troponin I: 0.01 ng/mL (ref 0.00–0.06)
Troponin I: 0.02 ng/mL (ref 0.00–0.06)

## 2011-03-22 LAB — COMPREHENSIVE METABOLIC PANEL
AST: 20 U/L (ref 0–37)
BUN: 16 mg/dL (ref 6–23)
CO2: 24 mEq/L (ref 19–32)
Calcium: 9.2 mg/dL (ref 8.4–10.5)
Creatinine, Ser: 0.8 mg/dL (ref 0.4–1.2)
GFR calc Af Amer: >60 mL/min (ref >60)
GFR calc non Af Amer: >60 mL/min (ref >60)

## 2011-03-22 LAB — COMPREHENSIVE METABOLIC PANEL WITH GFR
ALT: 9 U/L (ref 0–35)
Albumin: 2.9 g/dL — ABNORMAL LOW (ref 3.5–5.2)
Alkaline Phosphatase: 70 U/L (ref 39–117)
Chloride: 102 meq/L (ref 96–112)
Glucose, Bld: 209 mg/dL — ABNORMAL HIGH (ref 70–99)
Potassium: 4.3 meq/L (ref 3.5–5.1)
Sodium: 134 meq/L — ABNORMAL LOW (ref 135–145)
Total Bilirubin: 0.1 mg/dL — ABNORMAL LOW (ref 0.3–1.2)
Total Protein: 7.1 g/dL (ref 6.0–8.3)

## 2011-03-22 LAB — BASIC METABOLIC PANEL
Calcium: 9.4 mg/dL (ref 8.4–10.5)
Chloride: 99 mEq/L (ref 96–112)
Creatinine, Ser: 0.83 mg/dL (ref 0.4–1.2)
GFR calc Af Amer: >60 mL/min (ref >60)
Sodium: 133 mEq/L — ABNORMAL LOW (ref 135–145)

## 2011-03-22 LAB — URINE CULTURE

## 2011-03-22 LAB — TSH: TSH: 1.172 u[IU]/mL (ref 0.350–4.500)

## 2011-03-22 LAB — CK TOTAL AND CKMB (NOT AT ARMC)
CK, MB: 1.4 ng/mL (ref 0.3–4.0)
Relative Index: INVALID (ref 0.0–2.5)

## 2011-03-22 LAB — TROPONIN I: Troponin I: 0.03 ng/mL (ref 0.00–0.06)

## 2011-03-23 LAB — COMPREHENSIVE METABOLIC PANEL
ALT: 10 U/L (ref 0–35)
AST: 16 U/L (ref 0–37)
Albumin: 2.8 g/dL — ABNORMAL LOW (ref 3.5–5.2)
Alkaline Phosphatase: 71 U/L (ref 39–117)
BUN: 17 mg/dL (ref 6–23)
CO2: 25 mEq/L (ref 19–32)
Calcium: 8.5 mg/dL (ref 8.4–10.5)
Creatinine, Ser: 0.64 mg/dL (ref 0.4–1.2)
GFR calc Af Amer: >60 mL/min (ref >60)
GFR calc non Af Amer: >60 mL/min (ref >60)
Glucose, Bld: 164 mg/dL — ABNORMAL HIGH (ref 70–99)
Potassium: 4 mEq/L (ref 3.5–5.1)
Sodium: 132 mEq/L — ABNORMAL LOW (ref 135–145)
Total Protein: 6.6 g/dL (ref 6.0–8.3)
Total Protein: 6.9 g/dL (ref 6.0–8.3)

## 2011-03-23 LAB — CARDIAC PANEL(CRET KIN+CKTOT+MB+TROPI)
Relative Index: 3 — ABNORMAL HIGH (ref 0.0–2.5)
Total CK: 125 U/L (ref 7–177)
Troponin I: 0.01 ng/mL (ref 0.00–0.06)

## 2011-03-23 LAB — GLUCOSE, CAPILLARY
Glucose-Capillary: 155 mg/dL — ABNORMAL HIGH (ref 70–99)
Glucose-Capillary: 172 mg/dL — ABNORMAL HIGH (ref 70–99)
Glucose-Capillary: 183 mg/dL — ABNORMAL HIGH (ref 70–99)
Glucose-Capillary: 218 mg/dL — ABNORMAL HIGH (ref 70–99)
Glucose-Capillary: 71 mg/dL (ref 70–99)
Glucose-Capillary: 87 mg/dL (ref 70–99)
Glucose-Capillary: 121 mg/dL — ABNORMAL HIGH (ref 70–99)
Glucose-Capillary: 130 mg/dL — ABNORMAL HIGH (ref 70–99)
Glucose-Capillary: 215 mg/dL — ABNORMAL HIGH (ref 70–99)
Glucose-Capillary: 246 mg/dL — ABNORMAL HIGH (ref 70–99)

## 2011-03-23 LAB — POCT I-STAT, CHEM 8
Calcium, Ion: 1.21 mmol/L (ref 1.12–1.32)
Glucose, Bld: 71 mg/dL (ref 70–99)
HCT: 35 % — ABNORMAL LOW (ref 36.0–46.0)
Hemoglobin: 11.9 g/dL — ABNORMAL LOW (ref 12.0–15.0)
TCO2: 25 mmol/L (ref 0–100)

## 2011-03-23 LAB — URINALYSIS, ROUTINE W REFLEX MICROSCOPIC
Bilirubin Urine: NEGATIVE
Glucose, UA: NEGATIVE mg/dL
Ketones, ur: NEGATIVE mg/dL
Nitrite: NEGATIVE
Specific Gravity, Urine: 1.009 (ref 1.005–1.030)
pH: 5.5 (ref 5.0–8.0)

## 2011-03-23 LAB — IRON AND TIBC
Iron: 120 ug/dL (ref 42–135)
Saturation Ratios: 34 % (ref 20–55)
TIBC: 349 ug/dL (ref 250–470)
UIBC: 229 ug/dL

## 2011-03-23 LAB — DIFFERENTIAL
Basophils Absolute: 0.1 10*3/uL (ref 0.0–0.1)
Basophils Absolute: 0.1 10*3/uL (ref 0.0–0.1)
Basophils Relative: 1 % (ref 0–1)
Basophils Relative: 1 % (ref 0–1)
Eosinophils Absolute: 0.2 10*3/uL (ref 0.0–0.7)
Eosinophils Absolute: 0.2 10*3/uL (ref 0.0–0.7)
Eosinophils Absolute: 0.2 10*3/uL (ref 0.0–0.7)
Lymphocytes Relative: 22 % (ref 12–46)
Lymphs Abs: 2.9 10*3/uL (ref 0.7–4.0)
Monocytes Absolute: 0.7 10*3/uL (ref 0.1–1.0)
Monocytes Absolute: 0.8 10*3/uL (ref 0.1–1.0)
Monocytes Absolute: 0.9 10*3/uL (ref 0.1–1.0)
Monocytes Relative: 8 % (ref 3–12)
Monocytes Relative: 11 % (ref 3–12)
Neutro Abs: 5.3 10*3/uL (ref 1.7–7.7)
Neutro Abs: 3.8 10*3/uL (ref 1.7–7.7)
Neutro Abs: 5.5 10*3/uL (ref 1.7–7.7)
Neutrophils Relative %: 65 % (ref 43–77)

## 2011-03-23 LAB — POCT CARDIAC MARKERS
CKMB, poc: <1 ng/mL — ABNORMAL LOW (ref 1.0–8.0)
Troponin i, poc: <0.05 ng/mL (ref 0.00–0.09)
Troponin i, poc: <0.05 ng/mL (ref 0.00–0.09)

## 2011-03-23 LAB — CBC
HCT: 35.5 % — ABNORMAL LOW (ref 36.0–46.0)
HCT: 32 % — ABNORMAL LOW (ref 36.0–46.0)
Hemoglobin: 11.9 g/dL — ABNORMAL LOW (ref 12.0–15.0)
MCV: 95.8 fL (ref 78.0–100.0)
Platelets: 127 10*3/uL — ABNORMAL LOW (ref 150–400)
RBC: 3.2 MIL/uL — ABNORMAL LOW (ref 3.87–5.11)
RBC: 3.7 MIL/uL — ABNORMAL LOW (ref 3.87–5.11)
RDW: 14.5 % (ref 11.5–15.5)
RDW: 14.7 % (ref 11.5–15.5)
WBC: 6.8 10*3/uL (ref 4.0–10.5)
WBC: 9.2 10*3/uL (ref 4.0–10.5)
WBC: 8.6 10*3/uL (ref 4.0–10.5)

## 2011-03-23 LAB — URINE MICROSCOPIC-ADD ON

## 2011-03-23 LAB — BASIC METABOLIC PANEL
CO2: 27 mEq/L (ref 19–32)
Chloride: 107 mEq/L (ref 96–112)
GFR calc Af Amer: >60 mL/min (ref >60)
Potassium: 3.9 mEq/L (ref 3.5–5.1)

## 2011-03-23 LAB — VITAMIN B12: Vitamin B-12: 347 pg/mL (ref 211–911)

## 2011-03-24 LAB — CBC
HCT: 32.1 % — ABNORMAL LOW (ref 36.0–46.0)
MCHC: 32.8 g/dL (ref 30.0–36.0)
MCV: 93.7 fL (ref 78.0–100.0)
RBC: 3.44 MIL/uL — ABNORMAL LOW (ref 3.87–5.11)
RDW: 14.7 % (ref 11.5–15.5)
WBC: 9 10*3/uL (ref 4.0–10.5)

## 2011-03-24 LAB — COMPREHENSIVE METABOLIC PANEL
Albumin: 2.9 g/dL — ABNORMAL LOW (ref 3.5–5.2)
BUN: 12 mg/dL (ref 6–23)
Creatinine, Ser: 0.57 mg/dL (ref 0.4–1.2)
Total Bilirubin: 0.5 mg/dL (ref 0.3–1.2)
Total Protein: 6.6 g/dL (ref 6.0–8.3)

## 2011-03-24 LAB — URINE CULTURE

## 2011-03-24 LAB — BASIC METABOLIC PANEL
CO2: 25 mEq/L (ref 19–32)
Calcium: 9.2 mg/dL (ref 8.4–10.5)
Creatinine, Ser: 0.68 mg/dL (ref 0.4–1.2)
GFR calc Af Amer: >60 mL/min (ref >60)
GFR calc non Af Amer: >60 mL/min (ref >60)
Sodium: 135 mEq/L (ref 135–145)

## 2011-03-24 LAB — URINALYSIS, ROUTINE W REFLEX MICROSCOPIC
Glucose, UA: 100 mg/dL — AB
Protein, ur: NEGATIVE mg/dL
Urobilinogen, UA: 0.2 mg/dL (ref 0.0–1.0)

## 2011-03-24 LAB — DIFFERENTIAL
Basophils Relative: 1 % (ref 0–1)
Eosinophils Absolute: 0 10*3/uL (ref 0.0–0.7)
Lymphocytes Relative: 11 % — ABNORMAL LOW (ref 12–46)
Monocytes Relative: 9 % (ref 3–12)
Neutrophils Relative %: 79 % — ABNORMAL HIGH (ref 43–77)

## 2011-03-24 LAB — GLUCOSE, CAPILLARY
Glucose-Capillary: 131 mg/dL — ABNORMAL HIGH (ref 70–99)
Glucose-Capillary: 202 mg/dL — ABNORMAL HIGH (ref 70–99)
Glucose-Capillary: 291 mg/dL — ABNORMAL HIGH (ref 70–99)

## 2011-03-24 LAB — URINE MICROSCOPIC-ADD ON

## 2011-03-24 LAB — HEMOGLOBIN A1C: Mean Plasma Glucose: 177 mg/dL

## 2011-03-24 LAB — POCT CARDIAC MARKERS
CKMB, poc: <1 ng/mL — ABNORMAL LOW (ref 1.0–8.0)
Myoglobin, poc: 48.1 ng/mL (ref 12–200)

## 2011-04-28 NOTE — Discharge Summary (Signed)
[name], [name] NO.:  [id number]   MEDICAL RECORD NO.:  [id number]          PATIENT TYPE:  OBV   LOCATION:  3728                         FACILITY:  [facility]   PHYSICIAN:  Madolyn Frieze. Jens Som, MD, FACCDATE OF BIRTH:  1912-12-25   DATE OF ADMISSION:  05/30/2009  DATE OF DISCHARGE:  06/01/2009                               DISCHARGE SUMMARY   PROCEDURES:  None.   PRIMARY AND FINAL DISCHARGE DIAGNOSIS:  Chest pain, medical therapy for  coronary artery disease.   SECONDARY DIAGNOSES:  1. History of myocardial infarction in December 2009.  2. Hypertension.  3. Diabetes.  4. Anemia of chronic disease, iron 120 and folate greater than 20 this      admission with a hemoglobin of 10.5, hematocrit of 30.8.  5. History of nephrolithiasis.  6. Dyslipidemia.  7. Chronic pancreatitis.  8. History of left pelvic thrombosis.  9. Osteoporosis with a leg fracture in 2009 and subsequent gait      instability.  10.Skin cancer on the right hand.  11.Hypothyroidism.  12.Palpitations.  13.Status post hip replacement in 2002 as well as appendectomy and      hysterectomy.  14.Allergy or intolerance to PENICILLIN and POSSIBLY CIPRO.  15.Family history of coronary artery disease.  16.Out-of-hospital do not resuscitate.   TIME AT DISCHARGE:  41 minutes.   HOSPITAL COURSE:  [name] is a 77 year old female with known  coronary artery disease.  She had chest pain and came to the hospital  where she was admitted for further evaluation.   In the past, [name] had elected medical therapy for coronary  artery disease.  Cardiac enzymes were cycled but were negative.  Other  labs were within normal limits except for a mildly elevated BUN on  admission at 25 which resolved.  Her chest pain resolved.  She had been  on Imdur prior to admission and this was increased from 30-60 mg a day.  The atenolol was also increased to t.i.d.  She was started on Zantac for  possible reflux  symptoms as well.   On June 01, 2009, [name] was ambulating without chest pain or  shortness of breath.  She was evaluated by Dr. Jens Som and considered  stable for discharge with close outpatient followup.   DISCHARGE INSTRUCTIONS:  1. Her activity level is to be increased gradually.  2. She is encouraged to stick to a heart-healthy, diabetic diet.  3. She is to follow up with Dr. Daleen Squibb and our office will call her.  4. She is to follow up with Dr. Waynard Edwards as well.   DISCHARGE MEDICATIONS:  1. Atenolol 50 mg t.i.d.  2. Synthroid 112 mcg daily.  3. Januvia 50 mg as prior to admission.  4. Gemfibrozil 600 mg b.i.d.  5. Metformin 500 mg b.i.d.  6. Amlodipine 5 mg daily.  7. Furosemide 20 mg one-half tablet daily.  8. Potassium 10 mEq daily.  9. Lantus 17 units daily.  10.Aspirin 81 mg 3-4 times a week.  11.Nitroglycerin sublingual p.r.n.  12.Imdur 60 mg a day.  13.Zantac 150 mg a day.  [name] Demark, PA-C      Madolyn Frieze. Jens Som, MD, [facility]  Electronically Signed    RB/MEDQ  D:  06/01/2009  T:  06/02/2009  Job:  [id number]   cc:   [name], M.D.

## 2011-04-28 NOTE — H&P (Signed)
[name], [name] NO.:  [id number]   MEDICAL RECORD NO.:  [id number]          PATIENT TYPE:  OBV   LOCATION:  2001                         FACILITY:  [facility]   PHYSICIAN:  Brayton El, MD    DATE OF BIRTH:  01-17-1913   DATE OF ADMISSION:  06/13/2009  DATE OF DISCHARGE:                              HISTORY & PHYSICAL   Attending will be Dr. Valera Castle.   INTERVAL HISTORY:  The patient is a 77 year old white female, past  medical history significant for coronary disease, diabetes,  hypertension, hyperlipidemia, who was recently discharged from [facility] on June 19th after ruling out for acute myocardial infarction, who  is now presenting with recurrent chest discomfort.  The patient states  that since her discharge on June 19th, she has gotten along fairly well.  This evening while at rest, she experienced a substernal chest  discomfort which was worse than it was prior to her last admission.  There is some radiation of the chest discomfort down her arm into her  jaw.  The chest discomfort was not promptly relieved with 2  nitroglycerin, and the patient reported to the emergency room.  By the  time the patient arrived emergency room she was chest painfree.  Her  blood pressure during my interview is approximately 200/50 with a heart  rate in the 50s.  The patient states she has been compliant with all  other medications in that her daughter organizes them for her, and she  takes them regularly, 3 times daily.  Other than the chest discomfort  today, the patient has been in her normal state of health and feeling  relatively well.   MEDICATIONS:  Aspirin 81 mg approximately four times a week.  Atenolol  50 mg t.i.d. Synthroid 112 mcg daily.  Boniva 50 mg.  Gemfibrozil 600 mg  b.i.d., metformin 500 mg b.i.d., amlodipine 5 mg b.i.d. Lasix 10 mg  daily, potassium 10 mEq daily.  Lantus 17 units daily.  Imdur 60 mg  daily, Zantac 150 mg daily.   PHYSICAL  EXAMINATION:  Temperature 98.1, blood pressure 181/41 to  200/50, pulse 50 to 60, respirations 20, satting at 96% on room air.  GENERAL:  In no acute distress.  HEART: Regular rate and rhythm.  Distant heart sounds.  No murmur or  gallop.  LUNGS:  Mild crackles at the left base.  ABDOMEN:  Soft, nontender, nondistended.  EXTREMITIES:  No edema.  SKIN:  Is warm and dry.   LABS:  BMP is within normal limits except for glucose of 298.  Her CBC  is within normal limits.  Her platelet count was not reported her INR is  1.1.  Her troponin is less than 0.05.  Her CK-MB is 1.2.   Chest x-ray shows hyperinflation but no acute process.   EKG shows normal sinus rhythm with rare PVCs.   ASSESSMENT/PLAN:  1. Chest discomfort in the setting of poorly controlled hypertension.  2. Mild bradycardia.   PLAN:  The patient will be admitted to observation and ruled out for  myocardial infarction.  Because of the patient's  relative bradycardia,  we will decrease her atenolol dose from 50 mg t.i.d. to 50 mg b.i.d..  We will increase her amlodipine from 5 mg 10 mg daily, which should  provide some increased blood pressure control especially similar  decrease her atenolol and improved anginal control.  The patient will be  admitted, placed on Lovenox 30 mg subcu daily for DVT prophylaxis. If  the patient remained chest painfree, it is likely we will be able to  discharge her within 24 to 48 hours.      Brayton El, MD  Electronically Signed     SGA/MEDQ  D:  06/13/2009  T:  06/14/2009  Job:  [id number]

## 2011-04-28 NOTE — H&P (Signed)
NAMESHARONICA, [name] NO.:  [id number]   MEDICAL RECORD NO.:  [id number]          PATIENT TYPE:  OBV   LOCATION:  1823                         FACILITY:  [facility]   PHYSICIAN:  Unice Cobble, MD     DATE OF BIRTH:  1913-05-19   DATE OF ADMISSION:  05/30/2009  DATE OF DISCHARGE:                              HISTORY & PHYSICAL   CARDIOLOGIST:  Maisie Fus C. Wall, MD, [facility]   CHIEF COMPLAINTS:  Chest pain.   HISTORY OF PRESENT ILLNESS:  This is a 77 year old white female with a  history of coronary artery disease, status post MI, diabetes mellitus,  hypertension, hyperlipidemia who presents with chest pain.  The patient  had chest pain at 4 a.m. and 9 a.m., both relieved by one sublingual  nitroglycerin each.  A 4 p.m., she had another episode not relieved by  sublingual nitroglycerin.  The patient describes the pain beneath her  left breast without radiation.  She does not have shortness of breath,  diaphoresis or palpitations or lightheadedness.  She has had a history  of silent myocardial infarctions in the past.  In the emergency  department her first and second troponins were negative.  Her EKG showed  no ST or T-wave changes.  She has not complained of edema, PND, or  orthopnea.  She is otherwise fairly active and her son-in-law tells me  that she was actually on the porch yesterday scrubbing off mold with a  scrub brush.   PAST MEDICAL HISTORY:  1. Coronary artery disease with an ejection fraction 45%.  She had a      non-STEMI in January 2010.  2. Hypertension.  3. Diabetes mellitus type 2.  4. History of nephrolithiasis in 2009.  5. Anemia of chronic disease.  6. Dyslipidemia.  7. Chronic pancreatitis on CT.  8. Left pelvic thrombosis.  9. Osteoporosis with leg fracture in 2009.  10.Gait instability  11.Skin cancer on the right hand  12.Primary hypothyroidism.  13.Irregular heartbeat.  14.Hip replacement in 2002.  15.Status post appendectomy.  16.Status post hysterectomy.   ALLERGIES:  PENICILLIN and possibly CIPRO.   MEDICATIONS:  1. Atenolol 50 mg t.i.d.  2. Januvia 50 mg daily.  3. Gemfibrozil 600 mg b.i.d.  4. Metformin 500 mg b.i.d.  5. Aspirin 81 mg daily.  6. Lasix 20 mg daily.  7. Potassium 10 mEq daily.  8. Amlodipine 5 mg daily.  9. Synthroid 112 mcg daily.  10.Lantus 17 units q.a.m.  11.Sublingual nitroglycerin p.r.n.  12.Imdur 30 mg daily.   SOCIAL HISTORY:  She lives alone.  She is widowed.  She has two  children.   FAMILY HISTORY:  Positive for coronary artery disease and diabetes  mellitus, but no stroke.   REVIEW OF SYSTEMS:  Complete review of systems done.  Found to be  otherwise negative except as stated in the HPI.  She was admitted  several weeks ago for a urinary tract infection.   CODE STATUS:  DNR/DNI.   PHYSICAL EXAMINATION:  VITAL SIGNS:  Temperature is afebrile, with a  pulse of 60, respiratory rate of 20, blood pressure  167/57, oxygen  saturation 98% on 2 liters.  General:  She is thin, in no acute distress.  HEENT:  Shows NCAT, PERRLA, EOMI, MMM. Oropharynx without erythema or  exudates.  NECK:  Supple without lymphadenopathy, thyromegaly, bruits or jugular  venous distention.  HEART:  Has a regular rate and rhythm with multiple irregular PACs/PVCs.  She has a 2/6 systolic murmur best heard in the left upper sternal  border.  SKIN:  Shows various bruises.  ABDOMEN:  Soft and nontender with normal bowel sounds.  No rebound or  guarding.  EXTREMITIES:  No cyanosis, clubbing or edema.  NEUROLOGICALLY:  She is alert and oriented x3.  Her cranial nerves II  through XII grossly intact with exception of hearing.  Strength is 5/5  in all extremities and axial groups.   RADIOLOGY/LABORATORY REVIEW:  Chest x-ray shows longstanding  cardiomegaly and emphysema.  EKG shows a rate of 58 and sinus  bradycardia with PACs.  She has left anterior fascicular block and a  normal axis.  She has  anteroseptal Q-waves which are old.  She has  nonspecific anterior septal T-wave inversions which were present on  prior EKGs.   LABORATORY:  Normal white count and hemoglobin of 11.9.  Creatinine is  0.9, potassium is 3.9.  She did CK-MB and troponin are negative x2.  She  has trace leukocyte esterase in her urine.   ASSESSMENT/PLAN:  This is a 77 year old white female with a history of  silent myocardial infarction who presents with chest pain.  Her symptoms  are mild, but were relieved by nitroglycerin and time.  I suspect this  is her usual angina and she will need an up-titration of her antianginal  medications.  Imdur will be increased to 60 mg  daily in this regard.  She will be admitted to observation for blood  pressure and telemetry monitoring overnight and for rule out with final  troponins.  She is not a good candidate for further study due to frailty  of age and DNR wishes.  I anticipate discharge home in the morning  should her enzymes prove to be negative.      Unice Cobble, MD  Electronically Signed     ACJ/MEDQ  D:  05/30/2009  T:  05/31/2009  Job:  [id number]

## 2011-04-28 NOTE — Assessment & Plan Note (Signed)
[name]                            CARDIOLOGY OFFICE NOTE   NAME:[name], [name]                     MRN:          [id number]  DATE:11/28/2008                            DOB:          1913-06-09    PRIMARY CARDIOLOGIST:  Jesse Sans. Daleen Squibb, MD, [facility]   PRIMARY CARE PHYSICIAN:  Mark A. Perini, MD   This is a 77 year old white female patient who presented to the hospital  with an ST-elevation MI on November 12, 2008 because of her age,  peripheral vascular disease, and DNR status.  No invasive testing or  intervention was done.  She did have an echocardiogram on November 14, 2008, that showed severe hypokinesis of the anterior wall and septum.  Overall LV function was mildly decreased.  EF was 45%.   Since the patient has been home, she has had 3 different episodes of  chest pain and palpitations,  Two of the episodes were when she first  woke up in the morning, early morning where she felt tightening in her  upper abdomen and palpitations.  The third episode occurred during the  day, but mostly at rest.  She took 1 nitroglycerin each time with  relief.  Her daughter does state that she was on atenolol 50 mg t.i.d.  prior to going into the hospital and they placed her on 50 b.i.d.  They  also added isosorbide 10 mg t.i.d.  The daughter takes her blood  pressure at home and has been in the 140-150 range systolically.   CURRENT MEDICATIONS:  1. Isosorbide 10 mg t.i.d.  2. Atenolol 50 mg b.i.d.  3. Synthroid 112 mcg daily.  4. Gemfibrozil 600 mg b.i.d.  5. Aspirin 81 mg every other day.  6. Amaryl 2 mg daily.  7. Metformin 500 mg b.i.d.  8. Amlodipine 5 mg daily.  9. Furosemide 20 mg one-half daily.  10.Potassium 10 mEq daily.  11.Lantus 11 units daily.  12.Plavix 75 mg daily.   PHYSICAL EXAMINATION:  GENERAL:  This is a very pleasant young-looking  77 year old white female in no acute distress.  VITAL SIGNS:  Blood pressure 140/60, pulse 52,  and weight 101.  NECK:  Without JVD, HJR, bruit, or thyroid enlargement.  LUNGS:  Clear in anterior, posterior, and lateral.  HEART:  Regular rate and rhythm at 52 beats per minute.  Distant heart  sounds.  No murmur, rub, bruit, thrill, or heave noted.  ABDOMEN:  Soft without organomegaly, masses, lesions, or abnormal  tenderness.  EXTREMITIES:  Without cyanosis, clubbing or edema.  Decreased distal  pulses.   EKG is being performed as I dictate.   IMPRESSION:  1. ST-elevation myocardial infarction on November 12, 2008 with      recurrent chest pain and palpitations.  No catheter or stress test      done, given the patient's age, vascular disease, and do not      resuscitate status.  2. Left ventricular dysfunction.  Ejection fraction was 45% with      severe hypokinesis of the anterior wall and septum.  3. History of paroxysmal atrial fibrillation.  4. History of diabetes mellitus, type 2.  5. Hypertension.  6. Hyperlipidemia.  7. Hypothyroidism  8. History of peripheral vascular disease.   PLAN:  I am going to increase her atenolol back up to 50 mg t.i.d.  I am  also going to give her Imdur 30 mg long-acting to see if this does not  make a difference.  If she has had any recurrent chest pain, she is to  call us and she will follow up with Dr. Daleen Squibb in 1 month.      [name] Reedy, PA-C  Electronically Signed      Everardo Beals. Juanda Chance, MD, [facility]  Electronically Signed   ML/MedQ  DD: 11/28/2008  DT: 11/29/2008  Job #: [id number]

## 2011-04-28 NOTE — Discharge Summary (Signed)
[name], [name] NO.:  [id number]   MEDICAL RECORD NO.:  [id number]          PATIENT TYPE:  OBV   LOCATION:  2001                         FACILITY:  [facility]   PHYSICIAN:  Jesse Sans. Wall, MD, FACCDATE OF BIRTH:  Nov 04, 1913   DATE OF ADMISSION:  06/14/2009  DATE OF DISCHARGE:  06/15/2009                               DISCHARGE SUMMARY   PRIMARY CARDIOLOGY:  Maisie Fus C. Wall, MD, [facility].   DISCHARGE DIAGNOSIS:  Unstable angina.   SECONDARY DIAGNOSES:  1. Bradycardia.  2. History of myocardial infarction in December 2009.  3. Hypertension.  4. Diabetes mellitus.  5. Anemia of chronic disease.  6. History of nephrolithiasis.  7. Hyperlipidemia.  8. Chronic pancreatitis.  9. History of left pelvic thrombosis.  10.Osteoporosis.  11.History of skin cancer in right hand.  12.Hypothyroidism.  13.Palpitations.  14.Status post hip replacement in 2002.  15.Status post appendectomy.  16.Status post hysterectomy.   ALLERGIES:  PENICILLIN and CIPRO.   PROCEDURES:  None.   HISTORY OF PRESENT ILLNESS:  A 77 year old female with prior history as  outlined above.  She was recently discharged from [facility] on June 19  following admission for chest discomfort.  Following discharge, she had  done well initially but then on the evening of admission developed  substernal chest discomfort with radiation to her jaw.  Discomfort  relieved promptly after 2 nitroglycerin and she presented to the ED  where she was found to be hypertensive with blood pressure of 200/50.  She was admitted for further evaluation.   HOSPITAL COURSE:  The patient ruled out and had no additional chest  discomfort.  We titrated her amlodipine to 10 mg daily, at the same time  reducing her atenolol secondary to baseline bradycardia.  She was  maintained on long acting nitrate therapy.  She will be discharged home  today in good condition.   DISCHARGE LABS:  Hemoglobin 10.4, hematocrit 30.3, WBC 9.5,  platelets  120, sodium 133, potassium 4.4, chloride 99, CO2 25, BUN 22, creatinine  0.83, glucose 298.  CK 22, MB 1.4, troponin I 0.01, and TSH 1.172.   DISPOSITION:  The patient will be discharged home today in good  condition.   FOLLOWUP PLANS AND APPOINTMENTS:  She will follow up with Dr. Daleen Squibb in  approximately 2-3 weeks.   DISCHARGE MEDICATIONS:  1. Aspirin 81 mg daily.  2. Synthroid 112 mcg daily.  3. Metformin 500 mg b.i.d.  4. Gemfibrozil 600 mg b.i.d.  5. Amlodipine 10 mg daily.  6. Zantac 150 mg daily.  7. Lasix 20 mg half tablet daily.  8. Atenolol 25 mg b.i.d.  9. Imdur 30 mg daily.  10.Januvia 50 mg daily.  11.Potassium 10 mEq daily.  12.Lantus 17 units daily a.m.  13.Nitroglycerin 0.4 mg sublingual p.r.n. chest pain.   OUTSTANDING LAB STUDIES:  None.   DURATION OF DISCHARGE/ENCOUNTER:  Thirty five minutes including  physician time.      [name], ANP      Jesse Sans. Daleen Squibb, MD, [facility]  Electronically Signed    CB/MEDQ  D:  06/15/2009  T:  06/16/2009  Job:  [id number]

## 2011-04-28 NOTE — Assessment & Plan Note (Signed)
[facility]                            CARDIOLOGY OFFICE NOTE   NAME:GLIDEWELLWajiha, Versteeg                     MRN:          [id number]  DATE:01/02/2009                            DOB:          September 06, 1913    [name] returns today after being discharged from the hospital  with chest pain.  During that admission, she had increased troponins.   Echocardiogram showed severe hypokinesia of the anterior wall and  septum.  EF was about 45%.  There was no significant valvular disease.   It was felt that she may have had an out of hospital MI with a non-Q-  wave infarction.   Since discharge, she has not had any more chest pain, not having any  symptoms cardiac wise.  When her atenolol was reduced in the hospital,  her daughter says that she was having lot of palpitations.  She has had  a history of symptomatic PACs and PVCs in the past.   Her biggest concern is psychosocial issues.  She is living with her  daughter, feels useless, and is upset that she can no longer drive.  She  had been told by Ophthalmology that she has macular generation, is  probably not good to drive according to her daughter.  When she drive,  she only drives short distance.   CURRENT MEDICATIONS:  1. Isosorbide 10 mg p.o. t.i.d.  2. Atenolol 50 mg in the morning, 100 in the evening.  3. Synthroid 0.112 mg per day.  4. Gemfibrozil 600 mg p.o. b.i.d.  5. Amaryl 2 mg per day.  6. Aspirin 81 mg every other day.  7. Metformin 500 mg p.o. b.i.d.  8. Amlodipine 5 mg a day.  9. Furosemide 10 mg per day.  10.Potassium 10 mEq a day.  11.Lantus 11 units daily.  12.Plavix 75 mg per day.   PHYSICAL EXAMINATION:  VITAL SIGNS:  Her blood pressure today is 160/80,  her pulse is 60 and regular, weight is 102.  She is a very pleasant  elderly lady in no acute distress.  HEENT:  She wears glasses, otherwise unremarkable.  NECK:  Supple.  Carotid upstrokes were equal bilaterally without  bruits,  no JVD.  Thyroid is not enlarged.  Trachea is midline.  LUNGS:  Clear.  HEART:  Regular rate and rhythm.  ABDOMEN:  Soft, good bowel sounds.  No midline bruits.  EXTREMITIES:  There is no cyanosis, clubbing, or edema.  Pulses were  present, but reduced.  NEUROLOGICAL:  Grossly intact.  She is alert and oriented x3.   [name] is doing fairly well from our cardiac standpoint.  I have  made no changes in her medications.  I have encouraged her not to drive  and I have encouraged her daughter to try to find more useful things  that she can do at home, so she will feel value.  She says getting old  is no fun and I have lived too long.   I will plan on seeing her back again in a year.  At this point, his  medical therapy and no further tests  or intervention.     Thomas C. Daleen Squibb, MD, [facility]  Electronically Signed    TCW/MedQ  DD: 01/02/2009  DT: 01/02/2009  Job #: [id number]   cc:   Loraine Leriche A. Perini, M.D.

## 2011-04-28 NOTE — H&P (Signed)
[name], BREED NO.:  [id number]   MEDICAL RECORD NO.:  [id number]          PATIENT TYPE:  INP   LOCATION:  6526                         FACILITY:  [facility]   PHYSICIAN:  Darryl D. Prime, MD    DATE OF BIRTH:  [birth year]   DATE OF ADMISSION:  11/12/2008  DATE OF DISCHARGE:                              HISTORY & PHYSICAL   The patient is do not resuscitate.   PRIMARY CARE PHYSICIAN:  Mark A. Perini, MD.   CARDIOLOGIST:  Jesse Sans. Wall, MD, [facility]   CHIEF COMPLAINT:  Chest pain.   HISTORY OF PRESENT ILLNESS:  [name] is a 77 year old female with  no significant past medical history for coronary artery disease who  notes feeling a sudden onset of chest pressure anteriorly with no  radiation.  She felt hot.  No initial shortness of breath or nausea  associated with this but had little sweats.  When she got up to walk,  she felt nauseous and was very weak.  She vomited.  The patient's  daughter called the ambulance.  They gave her sublingual nitroglycerin,  which helped the symptoms significantly.  She was also given 2 of baby  aspirin as well.  The patient notes the chest pain was persistent in the  emergency room and was given sublingual nitroglycerin drip, which  resolved the chest pain, chest pressure.  The patient denies any recent  cough, fever, dysuria, or hematuria.  She notes recently her sister has  been living with her which has caused significant stress and her blood  pressure has been elevated due to this recently.   PAST MEDICAL HISTORY AND PAST SURGICAL HISTORY:  1. She has a history of chest pain in the past related to renal stone.  2. History of irregular heartbeat.  3. History of hypothyroidism.  4. History of hyperlipidemia.  5. History of diabetes type 2.  6. History of hypertension.  7. History of echocardiogram in February 2008 showing an EF of 55% to      60%.  8. The patient has a history of gait disturbance instability.  9.  History of urinary tract infection.  10.History of peripheral vascular disease.  11.She is status post appendectomy.  12  Status post hysterectomy.  1. Status post hemorrhoidectomy.  2. Status post repair of a leg with pinning.   ALLERGIES:  She is allergic to PENICILLIN and CIPROFLOXACIN.   MEDICATIONS:  1. Atenolol 50 mg daily.  2. Amaryl 2 mg daily.  3. Gemfibrozil 600 mg twice a day.  4. Metformin 500 mg twice a day.  5. Aspirin 81 mg daily.  6. Lasix 20 mg daily.  7. Potassium chloride 10 mEq daily.  8. Amlodipine 5 mg once a day.  9. Synthroid 0.112 mg daily.   FAMILY HISTORY:  Positive for coronary artery disease and diabetes, but  no stroke.   SOCIAL HISTORY:  She is widowed.  She has 2 children.   REVIEW OF SYSTEMS:  A 14-point review of systems negative unless stated  above.   PHYSICAL EXAMINATION:  VITAL SIGNS:  Blood pressure 178/77 with a  pulse  of 70, respiratory rate of 16, and saturation 100% on 2 liters nasal  cannula with temperature of 97.  GENERAL:  This is an elderly female, sitting up in bed, in no acute  distress.  HEENT:  Normocephalic and atraumatic.  Her pupils are equal, round, and  reactive to light.  Extraocular movements are intact.  NECK:  Normal carotid upstrokes with bilateral bruits noted, right  greater than left.  Jugular venous pressure is normal.  There is no  thyromegaly or thyroid nodules.  LUNGS:  Clear to auscultation bilaterally.  CARDIOVASCULAR:  The apex is discrete, nondisplaced with no signs of  heaves or lift.  Heart is regular rate and rhythm.  There is a 1/6  systolic ejection murmur at the left sternal border.  No signs of  gallops or diastolic murmurs.  ABDOMEN:  Soft, nontender, nondistended with no hepatosplenomegaly.  BACK:  No CVA tenderness.  There is no paraspinal tenderness.  EXTREMITIES:  No clubbing, cyanosis, or edema.  Peripheral pulses are 1+  bilaterally.  There are bilateral femoral bruits noted.  SKIN:   Warm and dry without rash.  LYMPHATIC:  No signs of lymphadenopathy.  NEUROLOGIC:  She is alert and oriented x4 with cranial nerves II through  XII grossly intact.  Strength and sensation grossly intact.   LABORATORY DATA:  Chest x- ray showed chronic changes with mild cardiac  enlargement.  There is no acute cardiopulmonary findings except for a  left lower lobe possible atelectasis.  White count is 7.6, hemoglobin of  11.9, hematocrit 34.4, segs of 70%, platelets large.  The patient's  sodium is 135, potassium of 4.4, chloride 99, bicarb 21, BUN 22,  creatinine of 0.9, glucose 329.  Cardiac markers, serial troponin is  0.11 and MB of 3.1 at 2121.  EKG showed normal sinus rhythm with rate of  69 beats per minute, PR interval is 171 with QRS of 79, QT corrected of  491.  The patient has signs of possible anterior septal infarct on EKG,  although she does have a difficult body habits as well.  The patient's  EKG is not changed from February 06, 2007.   ASSESSMENT AND PLAN:  This is a patient with history of hypertension,  now recently increased blood pressure due to emotional stressors who  presents with hypertensive urgency versus acute coronary syndrome.  She  will be admitted to telemetry bed on aspirin.  We will get cardiac  markers x3.  The patient will continue her antihypertensive in addition  to nitroglycerin drip.  She is do not resuscitate.  This was discussed  with the patient and  her wishes will be honored.  We will also check a TSH.  We will try to  control her blood pressure.  We will check a urinalysis to rule out a  possible urinary tract infection.  We will also recheck her platelets  and control her blood sugar.      Darryl D. Prime, MD  Electronically Signed     DDP/MEDQ  D:  11/13/2008  T:  11/13/2008  Job:  [id number]

## 2011-04-28 NOTE — Discharge Summary (Signed)
[name], [name] NO.:  [id number]   MEDICAL RECORD NO.:  [id number]          PATIENT TYPE:  INP   LOCATION:  1316                         FACILITY:  [facility]   PHYSICIAN:  Mark A. Perini, M.D.   DATE OF BIRTH:  1913-05-20   DATE OF ADMISSION:  05/12/2009  DATE OF DISCHARGE:  05/16/2009                               DISCHARGE SUMMARY   DISCHARGE DIAGNOSES:  1. Urinary tract infection.  2. Hypertension.  3. Type 2 diabetes.  4. Anemia.  5. Atherosclerotic coronary artery disease.  6. Mild Alzheimer's dementia.  7. Hypertension.  8. Hypothyroidism.  9. History of angina.   PROCEDURES:  None.   DISCHARGE MEDICATIONS:  1. Atenolol 50 mg 3 times daily.  2. Januvia 1/2 of 100 mg pill daily.  3. Gemfibrozil 600 mg twice a day.  4. Metformin 500 mg twice daily with food.  5. Aspirin 81 mg daily.  6. Lasix 20 mg daily.  7. Potassium 10 mEq daily.  8. Amlodipine 5 mg daily.  9. Synthroid 112 mcg daily.  10.Lantus 20 units each day.  11.Sublingual nitroglycerin if needed.  12.Imdur 30 mg daily.  13.Plavix 75 mg daily.  14.Exelon patch 4.6 mg per day.  Change daily.  15.Over-the-counter Align 1 tablet daily for 3 weeks.  16.Levaquin starting on May 17, 2009 to take 250 mg daily for 5      further days.   HISTORY OF PRESENT ILLNESS:  Seleta is a pleasant 77 year old female who  lives at home independently who presented with severe chills,  diaphoresis, and fever to 103 degrees.  She also had some presyncope.  In the emergency room it was felt that she might have a urinary tract  infection.  She initially responded well to some fluid resuscitation and  antibiotics.  She was admitted for further care.   HOSPITAL COURSE:  Russie was admitted to a regular bed.  She remained  stable from a cardiovascular and pulmonary standpoint during her stay.  Her do not resuscitate code status was maintained during her stay.  She  was treated with Rocephin and Cipro which  was then narrowed to Rocephin  monotherapy.  She grew out Klebsiella oxytoca which was sensitive to  Cipro and Rocephin but not ampicillin.  By May 16, 2009 she was deemed  stable for discharge home to continue a course of oral antibiotics.  Occupational and physical therapy evaluated the patient during her stay,  and felt that she was at her baseline and did not have any outpatient OT  or PT needs.   DISCHARGE PHYSICAL EXAMINATION:  Temperature 98.2, temperature max 99.1,  pulse 61, respiratory rate 20, blood pressure 159/52 and 130/45, blood  sugar 121-213, 98% saturation on room air.  She is alert and oriented x4.  LUNGS:  Clear to auscultation bilaterally with no wheezes, rales or  rhonchi.  HEART:  Regular rate and rhythm with no murmur, rub or gallop.  ABDOMEN:  Soft, nontender, nondistended with no mass or  hepatosplenomegaly.  There was no edema.   PROCEDURES:  The patient did have a renal ultrasound done on May 13, 2009 which showed mild pelviectasis of the right kidney with dilatation  of the proximal ureter, no caliectasis or features of obstruction, no  calculi.  The bilateral ureteral jets are seen in the bladder.  The left  kidney was 10.8 cm.  The right kidney was 10.6 cm in longitudinal  dimension.   DISCHARGE INSTRUCTIONS:  Emmagrace is to follow a low-salt diet.  She is to  increase her activity slowly and use her cane or walker at all times.  She is to call if she has any recurrent problems.  She is to call for a  visit in 2-3 weeks in our office.      Mark A. Perini, M.D.  Electronically Signed     MAP/MEDQ  D:  05/16/2009  T:  05/16/2009  Job:  [id number]

## 2011-04-28 NOTE — Consult Note (Signed)
VASCULAR SURGERY CONSULTATION   [name], [name]  DOB:  [birth year]                                       10/23/2008  ZOXWR#:[id number]   The patient is a 77 year old female referred by Dr. Waynard Edwards for vascular  surgery consultation regarding lower extremity circulation.  She states  that she has been having discomfort in her legs for the past few years  which has remained relatively stable but involves her buttock, thigh and  calf area with ambulation.  This starts fairly rapidly when she starts  walking.  She has no rest pain or history of nonhealing ulcers,  infection or ulceration.  She states that it is limiting her somewhat.  She is also concerned about the possibility of whether gangrene could  develop from this.   PAST MEDICAL HISTORY:  1. Insulin dependent diabetes mellitus.  2. Hypertension.  3. Hyperlipidemia.  4. Negative for coronary artery disease, congestive heart failure,      COPD or stroke.   PAST SURGICAL HISTORY:  1. Appendectomy.  2. Hysterectomy.  3. Hemorrhoidectomy.  4. Repair of leg fracture with pinning.   FAMILY HISTORY:  Positive for coronary artery disease and diabetes in a  brother.  Negative for stroke.   SOCIAL HISTORY:  She is widowed, has two children.  Does not use tobacco  or alcohol.   REVIEW OF SYSTEMS:  Is unremarkable.   ALLERGIES:  Cipro and penicillin.   MEDICATIONS:  Please see health history form.   PHYSICAL EXAMINATION:  Vital signs:  Blood pressure is 153/55, heart  rate 62, respirations 12.  General:  She is an elderly female who  appears her stated age.  Alert and oriented x3.  Neck:  Supple, 3+  carotid pulses palpable.  No bruits are audible.  Neurological:  Normal.  No palpable adenopathy in the neck.  Chest:  Clear to auscultation.  Cardiovascular:  Regular rhythm with no murmurs.  Upper extremity pulses  3+ bilaterally.  Abdomen:  Soft, nontender with no masses.  She has 3+  femoral and 3+  popliteal pulse on the right with no distal pulses  palpable.  Left leg has a 3+ femoral, 1+ popliteal pulse with no distal  pulses.  Both feet are adequately perfused with good capillary refill.  No evidence of gangrene or infection.   Lower extremity arterial Dopplers were done by [facility]  09/24/2008 which revealed evidence of severe superficial femoral  occlusive disease bilaterally with possible areas of severe stenosis in  the midportion and probable calcified distal vessels with  noncompressible arteries distally.   I think she does have typical tibial disease from diabetes mellitus as  well as atherosclerosis involving her superficial femoral arteries but  she is not at any jeopardy for limb loss and is getting along reasonably  well.  She is 77 years old, does not ambulate long distances and I do  not think any further evaluation or treatment is necessary for this lady  at this time unless her symptoms worsen or it becomes a limb salvage  situation.  She and her daughter seem to agree with this conclusion.  I  am happy to see her again in the future as necessary.   Quita Skye Hart [place], M.D.  Electronically Signed  JDL/MEDQ  D:  10/23/2008  T:  10/24/2008  Job:  [id number]   cc:  Mark A. Perini, M.D.

## 2011-04-28 NOTE — H&P (Signed)
NAMEZENAYA, ULATOWSKI NO.:  [id number]   MEDICAL RECORD NO.:  [id number]          PATIENT TYPE:  OBV   LOCATION:  1316                         FACILITY:  [facility]   PHYSICIAN:  Tera Mater. [place], M.D. DATE OF BIRTH:  [birth year]   DATE OF ADMISSION:  05/12/2009  DATE OF DISCHARGE:                              HISTORY & PHYSICAL   Ms. Joens is a 77 year old white female with history of coronary  disease, type 2 diabetes, peripheral vascular disease and hypothyroidism  who presented for evaluation.  She had a pretty good weekend in general  until Sunday morning.  She had slept well and had no difficulties during  night.  When she awakened Sunday morning, she felt some general malaise  and did not feel up to going to church due to leg pain.  She got up and  got dressed and about midday started feeling a bit chilled.  She went  back and put on a sweater and within just a couple of minutes had severe  shaking rigors.  This lasted for several minutes and she had a headache  afterwards.  She did have some sweating and noted that her chest did  show a bit of pressure but not in the typical way of her prior cardiac  disease.  She had no breathing trouble.  She had no GI symptoms.  She  did have some anorexia and did not eat.  She was seen by family members  and they called me late today in the day and she was brought over to the  emergency room late in the evening and basically the workup in the  emergency room was done by about 1:00 in the morning.  Workup in the  emergency room showed that she probably had a urinary tract infection.  Unfortunately, blood cultures were not done so we are not going to be  able to capture whether or not she had a bacteremia, but I do suspect  that she had this.  Given her general frailty, advanced age, and  severity of these rigors, she is brought in for observational status for  treatment for this.   PAST MEDICAL HISTORY:  1. Includes  significant coronary history.  She has an ejection      fraction of 45% and had a probable non-Q MI in January and had an      ST elevation MI in November 2009.  2. She has a history of nephrolithiasis in the past with an      obstructing right ureteral stone back in 2009.  3. She has a history of type 2 diabetes.  4. Anemia of chronic disease.  5. Dyslipidemia.  6. Chronic pancreatitis on CT scan.  7. Left pelvic thrombosis.  8. Senile osteoporosis with leg fracture last year.  9. Gait instability.  10.Skin cancer on the right hand.  11.Primary hypothyroidism.  12.She has had irregularity of the heartbeat.   PAST SURGICAL HISTORY:  1. She had a hip replacement in 2002.  2. Appendectomy.  3. Hysterectomy.   ALLERGIES:  Are to PENICILLIN which causes an unspecified reaction.  Initially,  they told us Cipro caused some nausea 2 weeks post therapy  and then another family member suggested perhaps because it caused her  blood pressure to be elevated.   MEDICATION LIST:  Fairly extensive and includes:  1. Atenolol 50 mg 3 times a day.  2. Januvia 50 mg once a day.  3. Gemfibrozil 600 mg twice a day.  4. Metformin 500 mg twice a day.  5. Aspirin 81 mg once daily.  6. Lasix 20 mg daily.  7. Potassium 10 mEq daily.  8. Amlodipine 5 mg daily.  9. Synthroid 112 mcg daily.  10.Lantus 17 units each morning.  11.Sublingual nitroglycerin as needed.  12.Imdur 30 mg daily.   EXAM:  VITAL SIGNS:  Here in the hospital, blood pressure initially  166/62, pulse 59, respirations 18, temperature 9.9, O2 sat was 99%.  GENERAL:  We have a thin, but fairly healthy-appearing white female  lying quietly in bed, perfectly flat with no dyspnea.  HEENT:  Sclerae anicteric.  Face appears generally symmetric.  Multiple  darkened skin lesions are present on the right and left side of her  face. Oral mucous membranes appear moist.  Oropharynx appears clear.  NECK:  Supple.  I cannot appreciate any  bruits.  LUNGS: Clear without wheezes, rales or rhonchi.  No accessory muscle  use.  HEART:  Slightly irregular and distant with a soft murmur.  ABDOMEN:  Soft, nontender with no hepatosplenomegaly well-healed scars  are present.  No CVA tenderness is present.  No masses or pulsations are  present.  EXTREMITIES:  Slightly diminished pulses with no edema.  CNS:  Patient is awake, alert.  Her mentation is excellent.  Speech is  clear.  No resting tremors present.  Pupils are equal bilaterally.  No  evidence of hallucinations or delusions are present.   LAB DATA:  A urine microscopic shows 11-20 white cells, 0-2 reds and  many bacteria.  The urine is yellow and cloudy, specific gravity 1.012,  pH of 5.5, trace blood, positive nitrate.  White count is 9000,  hemoglobin 10.7, platelets 104,000.  Her sodium was 135, potassium 3.8,  chloride 102, CO2 25, BUN 18, creatinine 0.68, glucose 270, calcium 9.2.  Cardiac markers show a myoglobin point of care to be 48, troponin less  than 0.05.  A chest x-ray shows thoracic compression fractures and no  active cardiopulmonary process.   SUMMARY:  We have a 77 year old white female presenting with rigors and  febrile illness.  This appears to be due to urinary tract infection with  significant white cells seen in the urine.  Culture is pending on this.  The patient has responded relatively well to initial fluid resuscitation  and antibiotics.  There is no evidence of any sepsis type syndrome  although platelets are a little bit low.  Liver function testing has not  been checked and will be checked today.  We will recheck her white count  as well.  The fact that she had an obstructing kidney stone in the past  and the severity of the onset of this, I think a renal ultrasound is a  reasonable next step.  Her cardiac status seems to be doing fairly well  with a cardiomyopathy, there is no evidence of any fluid overload at the  present time.  We will  hold her Lasix and potassium for one more day,  however.  Her primary hypothyroidism appears well-controlled with no  evidence of significant disruption.  Her peripheral vascular disease  is  causing no pain at rest.  She does have significant osteoporosis with  thoracic and hip compression fractures with no evidence of new fractures  of any sort.  There is no evidence of any pulmonary infiltrates.  Her  lipid medication will be held for a day here while we are getting her  settled in.  Gait instability is still an issue and the patient still  notes she is having difficulty at times.  Her  renal function does not appear be compromised.  Her overall response  therapy seems to be good in the initial phases here.  We will probably  try to get her home tomorrow on oral agents if we can get a culture and  sensitivity back on her urine.           ______________________________  Tera Mater Evlyn Kanner, M.D.     SAS/MEDQ  D:  05/13/2009  T:  05/13/2009  Job:  [id number]

## 2011-04-28 NOTE — Discharge Summary (Signed)
NAMEBRIYA, LOOKABAUGH NO.:  [id number]   MEDICAL RECORD NO.:  [id number]          PATIENT TYPE:  INP   LOCATION:  6526                         FACILITY:  [facility]   PHYSICIAN:  Noralyn Pick. Eden Emms, MD, FACCDATE OF BIRTH:  01-01-1913   DATE OF ADMISSION:  11/12/2008  DATE OF DISCHARGE:  11/16/2008                               DISCHARGE SUMMARY   PRIMARY CARDIOLOGIST:  Maisie Fus C. Wall, MD, [facility].   PRIMARY CARE PHYSICIAN:  Mark A. Perini, MD   PROCEDURES PERFORMED DURING HOSPITALIZATION:  None.   FINAL DISCHARGE DIAGNOSES:  1. ST elevated myocardial infarction.  2. Known coronary artery disease.  3. Hypertension.  4. Diabetes type 2.  5. Hyperlipidemia.  6. Hypothyroidism.  7. Peripheral vascular disease.  8. History of irregular heart rhythm.   HOSPITAL COURSE:  This is a 77 year old Caucasian female with history as  stated above who had sudden onset of chest pressure anteriorly with no  radiation.  The patient denied no shortness of breath initially, but did  have some mild diaphoresis.  The patient states that she felt nauseous  and very weak when she got up to walk and vomited.  She was brought to  [facility] Room where she was given two baby aspirin and  nitroglycerin.  The patient was found to be significantly hypertensive  with a blood pressure of 178/77.  The patient's pain was relieved.  The  patient was admitted by Dr. Launa Flight, fellow for Dr. Juanito Doom and  cardiac enzymes were cycled.  The patient was also diagnosed with  emotional stressors as precipitating the patient's hypertensive urgency  and chest pain.  The patient was placed on nitroglycerin drip and  followed.  Initial troponin was 0.11 with subsequent troponin 0.86 and  7.48.  The patient had diffuse ST-T wave changes noted inferolaterally.  The patient was also started on a heparin drip with plans to discuss  cast versus stress Myoview secondary to high ischemic burden.   Dr.  Waynard Edwards saw the patient on courtesy consult to assist with  medications and listed medications and dosages as he would like the  patient to continue, which were well documented.  The patient had an  echocardiogram with an EF of 40% with apical hypokinesis, which were new  since prior study in February 2008.   After discussion with the patient and daughter, Dr. Eden Emms determined  that the patient will be treated medically.  Given her age, peripheral  vascular disease, and DNR status, the patient requested no invasive  intervention or testing.  The patient was seen and examined by Dr. Charlton Haws on day of discharge and found to be stable.  Her peripheral  pulses were diminished.  Her heart rate was 65 and her blood pressure  was 115/40.  The patient had no further complaints of chest discomfort.  It was discussed with the patient that she has a risk for additional MI,  heart failure, and CVA.  She is aware of the risk and agrees to be  treated medically.  It was also discussed that the patient did not need  to be home alone and have supervision or assistance there.  At that  time, the patient refused.  Her daughter is very attentive and will be  checking in on her; however, this may need to change depending upon the  patient's health status as it declines.   DISCHARGE LABS:  Cardiac enzymes troponin initially 0.11, 0.86, 5.45,  and 7.48.  TSH 0.578.  Magnesium 1.6.  Hemoglobin 11.0, hematocrit 32.5,  white blood cells 9.6, platelets 168.  Hemoglobin 11.9, hematocrit 34.1,  white blood cells 7.6, platelets were inadequate to count. Chest x-ray,  stable cardiomegaly, left lower lobe atelectasis versus pneumonia and no  evidence of pulmonary edema dated November 12, 2008.  Echocardiogram  dated November 14, 2008, severe hypokinesis of the anterior wall and the  septum.  Other walls are vigorous.  There does not appear to be  significant dysfunction of the apex.  The tissue Doppler data  suggests  increased filling pressure.  Overall, left ventricular systolic function  mildly decreased.  Left ventricular ejection fraction estimated 45%.  Left ventricular wall thickness was moderately increased.  These  findings and abnormalities are new since study of February 2008. EKG  revealing sinus bradycardia with premature atrial contractions.  The  patient had anterolateral T-wave inversion with evidence of septal  infarct.   DISCHARGE MEDICATIONS:  1. Furosemide 20 mg daily.  2. Metformin HCl 500 mg twice a day.  3. Atenolol 50 mg daily.  4. Glimepiride 2 mg daily.  5. Amlodipine 5 mg daily.  6. Synthroid 112 mcg daily.  7. Gemfibrozil 600 mg twice a day.  8. Potassium chloride ER 10 mEq daily.  9. Lantus 11 units subcu every morning.  10.Plavix 75 mg daily.  11.Nitroglycerine 0.4 mg as needed.  12.Colace 100 mg one or two tablets daily as needed.  13.Isosorbide 10 mg three times a day.   ALLERGIES:  PENICILLIN and CIPRO.   FOLLOWUP PLANS AND APPOINTMENTS:  1. The patient is to follow with Dr. Juanito Doom on an appointment in 1      month.  2. The patient will follow with Dr. Waynard Edwards, primary care physician.      She is to call to make that appointment.  3. The patient has been advised of new medications and medication      dosage adjustment to include the atenolol dose reduced and the      glimepiride dose reduced.   Time spent with the patient to include physician time 45 minutes.     Bettey Mare. Lyman Bishop, NP      Noralyn Pick. Eden Emms, MD, [facility]  Electronically Signed   KML/MEDQ  D:  11/16/2008  T:  11/16/2008  Job:  [id number]   cc:   Loraine Leriche A. Perini, M.D.

## 2011-04-28 NOTE — H&P (Signed)
[name], MOTT NO.:  [id number]   MEDICAL RECORD NO.:  [id number]          PATIENT TYPE:  INP   LOCATION:  2025                         FACILITY:  [facility]   PHYSICIAN:  Wendi Snipes, MD DATE OF BIRTH:  [birth year]   DATE OF ADMISSION:  07/01/2009  DATE OF DISCHARGE:                              HISTORY & PHYSICAL   CARDIOLOGIST:  Maisie Fus C. Wall, MD, [facility].   PRIMARY DOCTOR:  Mark A. Perini, M.D.   CHIEF COMPLAINTS:  Chest pain.   HISTORY OF PRESENT ILLNESS:  This is a 77 year old white female with a  history of coronary artery disease who presents with chest pain that  awoke her from sleep.  She states that the pain started as a pressure in  her left chest and radiates across her chest.  She also reports  associated sweatiness and weakness.  The symptoms lasted for  approximately 30 minutes and she took 1 nitroglycerin with some relief.  She describes the pain is a new pain than what she has been seen in  hospital before.  She has recently been evaluated several times for  unstable angina and she states that she would very much like to find out  what the cause of her pain is.   PAST MEDICAL HISTORY:  1. Coronary artery disease status post myocardial infarction in 2009.  2. Hypertension.  3. Insulin-dependent diabetes.  4. Anemic of chronic disease.  5. Nephrolithiasis.  6. Hyperlipidemia.  7. Chronic pancreatitis.  8. Osteoporosis.  9. Hypothyroidism.  10.Palpitations.   ALLERGIES:  PENICILLIN.   MEDICATIONS ON ADMISSION:  1. Aspirin 81 mg daily.  2. Synthroid 112 mcg micrograms daily.  3. Metformin 500 mg twice daily.  4. Gemfibrozil 600 mg twice daily.  5. Amlodipine 10 mg daily.  6. Zantac 150 mg daily.  7. Lasix 10 mg daily.  8. Atenolol 25 mg twice daily.  9. Imdur 30 mg daily.  10.Januvia 50 mg daily.  11.Potassium chloride 10 mEq daily.  12.Lantus insulin 17 units every morning.  13.Nitroglycerin as needed.   SOCIAL HISTORY:   She lives in [place] alone.  She is currently  widowed.  She is retired.  She does not smoke.   FAMILY HISTORY:  Positive for coronary disease and diabetes.   REVIEW OF SYSTEMS:  All 14 systems were reviewed and were negative  except as mentioned in detail in HPI.   PHYSICAL EXAMINATION:  Her blood pressure is 160/56, respiratory rate is  16.  Her pulse is 60 and regular.  She is satting 96% on room air.  GENERAL: She is an elderly, 77 year old white female, appearing stated  age in no acute distress.  HEENT: Moist mucous membranes.  Pupils equal, round, react to light and  accommodation.  Anicteric sclerae.  NECK:  No jugular venous distention.  No thyromegaly.  CARDIOVASCULAR: Regular rate and rhythm.  No murmurs, rubs or gallops.  LUNGS: Clear to auscultation bilaterally.  ABDOMEN:  Nontender, nondistended.  Positive bowel sounds.  No masses.  EXTREMITIES: No clubbing, cyanosis, edema.  NEUROLOGIC:  Alert and x3.  Cranial nerves II-XII grossly intact.  No  focal neurologic deficits.  SKIN:  Warm, dry and intact.  No rashes.  PSYCH:  Mood and affect are appropriate.   RADIOLOGY REVIEW:  Chest x-ray showed no acute cardiopulmonary process.  EKG shows normal sinus rhythm with a rate of 66 beats per minute with  anterior septal Q-waves indicating possibility of old anterior septal  infarct; no ST or T-wave ischemic changes compared to last tracing.   LABORATORY REVIEW:  White blood cell count 7.6, hematocrit 31.2,  platelets are 116.  Her potassium is 4.3, her creatinine is 0.8.  Her  troponins are less than 0.05.   ASSESSMENT/PLAN:  This is a 77 year old white female with a history of  coronary disease, frequently seen in the emergency room for chest pain  here with continued symptoms, though somewhat different from previous  episodes.  1. Chest pain.  Will currently treat as unstable angina though there      is no objective evidence of ischemia.  She will probably benefit       from a outpatient mechanism that keeps her out of the emergency      department and reassurance about her chest pain and consider was      very close follow-up as an outpatient.  Also consider increasing      her Imdur, though her chest pain is somewhat atypical and not      exertional.  2. Diabetes mellitus.  Continue current regimen and will start her on      sliding scale insulin.  3. Hypertension.  Her blood pressure is currently not at goal, though      she is under some duress and may have a normal blood pressure as an      outpatient.  Will monitor very carefully.      Wendi Snipes, MD  Electronically Signed     BHH/MEDQ  D:  07/01/2009  T:  07/01/2009  Job:  [id number]

## 2011-04-28 NOTE — Discharge Summary (Signed)
[name], LINES NO.:  [id number]   MEDICAL RECORD NO.:  [id number]          PATIENT TYPE:  OBV   LOCATION:  4705                         FACILITY:  [facility]   PHYSICIAN:  Geoffry Paradise, M.D.  DATE OF BIRTH:  1913-08-22   DATE OF ADMISSION:  12/07/2008  DATE OF DISCHARGE:                               DISCHARGE SUMMARY   CHIEF COMPLAINT:  Epigastric/chest discomfort with one episode of nausea  and vomiting   HISTORY OF PRESENT ILLNESS:  [name] is a 77 year old patient  shared by Dr. Waynard Edwards and Dr. Daleen Squibb who was just discharged early December  following a chest pain admission.  She is a do not resuscitate and has  known coronary disease as well as peripheral vascular disease.  She had  approximately a 4-5 day stay where medications were adjusted November 16, 2008, prior to discharge.  At that time and we discussed with Dr. Daleen Squibb  in the office setting it was felt that interventional attempt should not  be undertaken given her advanced age and comorbidities.  She has had an  echocardiogram in early 2008 with an EF of 55-60%.  Repeat  echocardiogram November 14, 2008, revealed severe hypokinesis anterior  wall and septum and a chest x-ray revealed left lower lobe atelectasis.  EF was felt to be about 45%.  There certainly has been a change.  She  did have a bump in enzymes and did have a course of nitroglycerin drip  but again the cardiology team and the patient determined no intervention  was in order or should be undertaken.  This shift seems to have been  palliative.  She presents today in the early morning hours after  awakening with some degree of epigastric discomfort and nausea seemingly  resolved with nitroglycerin.  The family is not at the bedside and the  story is not clear as to how long this lasted.  At this time she is pain-  free.  No shortness of breath.  No nausea or vomiting and feels quite  well.   MEDICATIONS:  1. Include furosemide 20  mg daily.  2. Metformin 500 b.i.d.  3. Atenolol 50 daily.  The atenolol has actually been decreased to      b.i.d. since the last discharge.  4. Glimepiride 2 mg daily  5. Amlodipine 5 mg daily.  6. Synthroid 112 mcg daily.  7. Gemfibrozil 600 b.i.d.  8. KCl 10 daily.  9. Lantus 11 q. a.m.  10.Plavix 75 mg daily.  11.Aspirin 81 mg daily.  12.Sublingual nitroglycerin p.r.n.  13.Imdur 10 mg t.i.d.  14.Colace 100 as needed.   PAST MEDICAL HISTORY:  Medical illnesses include ASCAD, hypothyroidism,  hyperlipidemia, diabetes mellitus type 2, hypertension, gait  instability, prior UTIs, PVD, appendectomy, hysterectomy and  hemorrhoidectomy.   ALLERGIES:  The patient is allergic to PENICILLIN and CIPRO.   SOCIAL HISTORY:  The patient resides with her daughter; widowed, has two  children, does not smoke or drink.   PHYSICAL EXAMINATION:  VITAL SIGNS:  At this time temperature is 98-99,  blood pressure 146/55, pulse 65, respiratory rate 20, O2 saturation  100%  on 2 liters.  GENERAL:  The patient is awake, alert, conversant.  HEENT: Good facial symmetry, anicteric.  NECK:  No JVD.  She does have bruits bilaterally.  LUNGS:  Lungs are clear.  CARDIOVASCULAR:  Regular rate and rhythm, 2/6 systolic ejection murmur.  ABDOMEN:  Soft, nontender, good bowel sounds.  EXTREMITIES:  No cyanosis, clubbing or edema.  Intact distal pulses.  Joints normal.  BACK:  Back is kyphotic.  NEUROLOGICAL:  She is nonlateralizing.   DATA:  Chest x-ray - cardiomegaly, COPD, no acute findings.  CBC -  hemoglobin is 12.7, hematocrit 38.3, white blood cell count is 13.4,  platelet count is 125,000.  D-dimer normal at 0.31.  Chemistry - sodium  138, potassium 3.7, chloride 98, CO2 29, glucose 194, BUN 32, creatinine  0.8.  Liver functions - bilirubin 0.6, alk phos 81, SGOT 18, SGPT 11,  protein 7.4, albumin 3.3, calcium 9.8, lipase 31.  CK was 240, MB of  3.5, index 1.5, troponin 0.05, myoglobin 69.1.    ASSESSMENT:  Chest pain in the setting of known ASCAD (arteriosclerotic  coronary artery disease) medically managed, quiescent at this time,  stable for discharge.   DISCUSSION:  The patient has been here less than 12 hours, has no  further symptoms and is not a candidate for revascularization or  intervention.  Approach appears to be palliative at this time.  I did  discuss this at length with her and reaffirmed her DNR status.  I see no  further interventions and the focus seems to be working out ways to cope  at home with this symptomatology to prevent further hospitalizations and  certainly get her home for Christmas.  At this time I have reviewed her  medications and will make no changes as she is asymptomatic.  Her EKG is  actually normal sinus rhythm at this time with PACs and no frank  ischemic changes.  She is to be discharged on her same medications to  the support of her home environment should they not be able to handle  her, to follow up in the office in outpatient setting as needed.  Should  she not be able to be stabilized there skilled nursing could be  considered and/or narcotics/benzodiazepines for symptom control.     ______________________________  Geoffry Paradise, M.D.    ______________________________  Geoffry Paradise, M.D.    RA/MEDQ  D:  12/07/2008  T:  12/07/2008  Job:  [id number]

## 2011-04-28 NOTE — Discharge Summary (Signed)
NAMEBRAYLIN, [name] NO.:  [id number]   MEDICAL RECORD NO.:  [id number]          PATIENT TYPE:  INP   LOCATION:  2025                         FACILITY:  [facility]   PHYSICIAN:  Verne Carrow, MDDATE OF BIRTH:  December 01, 1913   DATE OF ADMISSION:  07/01/2009  DATE OF DISCHARGE:  07/02/2009                               DISCHARGE SUMMARY   PRIMARY CARDIOLOGIST:  Maisie Fus C. Daleen Squibb, MD, [facility]   PRIMARY CARE PHYSICIAN:  Mark A. Perini, MD   DISCHARGE DIAGNOSIS:  Noncardiac chest pain.   SECONDARY DIAGNOSES:  1. Coronary artery disease status post ST-elevation myocardial      infarction on November 12, 2008 (medical management at the time due      to advanced age, comorbidities, and DNR status).  2. Peripheral vascular disease.  3. Hypertension.  4. Hyperlipidemia.  5. Insulin-dependent diabetes mellitus.  6. Anemia of chronic disease.  7. History of nephrolithiasis.  8. Chronic pancreatitis.  9. Osteoporosis.  10.Hypothyroidism.  11.History of palpitations.   ALLERGIES AND INTOLERANCES:  1. PENICILLIN (local swelling).  2. CIPROFLOXACIN.   PROCEDURES PERFORMED DURING THIS HOSPITALIZATION:  1. EKG:  Sinus rhythm, rate 66, anteroseptal Q-waves, no ST-T wave      ischemic changes compared to last tracing.  2. Chest x-ray:      a.     No acute cardiopulmonary disease.      b.     Cardiomegaly without congestive failure.  3. EKG:  Sinus bradycardia, rate 53, no significant change from prior      tracing.   HISTORY OF PRESENT ILLNESS:  [name] is a 77 year old Caucasian  female with a history of CAD, status post ST-elevation MI, November 2009  with medical management, and DNR status who presents with chest pain  that awoke her from her sleep.  The patient reports pain started as a  pressure in her left chest and radiated across her chest and then down  both arms.  The patient reports associated sweatiness and weakness.  Symptoms lasted for approximately  30 minutes, then she took one  sublingual nitroglycerin with minimal relief.  The patient reports these  symptoms are new.  She has been evaluated several times in the past with  unstable angina and she is frustrated with recurrent chest pain  symptoms.   HOSPITAL COURSE:  The patient was admitted and serial enzymes were  negative.  The patient was asymptomatic on the morning of July 01, 2009  and ambulated without symptoms that day with cardiac rehab.  The patient  again was asymptomatic on July 02, 2009 and ambulated 300 feet with  Cardiac Rehab again without symptoms.  The patient continues to have  mild stable anemia.  Vital signs revealed slight variations and systolic  blood pressure ranged from 98-162, but generally within normal limits  during hospital course.  Heart rate as low as 43 but generally in the  50s-70s.  Other than a urinary tract infection, the patient's labs were  unremarkable.  The patient was seen and evaluated by Dr. Verne Carrow on the morning of July 02, 2009 and deemed stable  for  discharge.  Her dose of Imdur will be increased from 30 daily to 30 mg  p.o. b.i.d.  She will follow up with a previously scheduled appointment  at her primary cardiologist's office on July 10, 2009 at 11:15 a.m.  The  patient will be written a prescription for Imdur as the dose was  increased and Bactrim DS p.o. b.i.d. x3 days for her UTI.  At the time  of discharge, the patient will receive her new medication list,  prescriptions, and followup instructions.  All questions and concerns  will be addressed at that time.   NOTE:  Inpatient diabetes consult requested and the recommendation is to  discontinue metformin as it is contraindicated in patients greater than  4 years old.  We will continue at this time and have the patient follow  up with her primary care Trevan Messman in regards to her antidiabetic  therapy.   DISCHARGE LABORATORY DATA:  WBC 6.0, HGB 9.8 down from 10.6  on  admission, HCT 28.9 down from 31.2 on admission, and PLT count 84 down  from 116 on admission.  Point-of-care markers negative x1 and three full  sets of cardiac enzymes were negative.   FOLLOWUP PLANS AND APPOINTMENTS:  Please see hospital course.   DISCHARGE MEDICATIONS:  1. Aspirin 81 mg p.o. daily.  2. Amlodipine 10 mg p.o. daily.  3. Atenolol 25 mg p.o. b.i.d.  4. Gemfibrozil 600 mg p.o. b.i.d.  5. Imdur 30 mg p.o. b.i.d. (up from daily).  6. Lasix 10 mg p.o. daily.  7. Metformin 500 mg p.o. b.i.d.  8. Synthroid 112 mcg p.o. daily.  9. Zantac 130 mg p.o. daily.  10.Lantus 17 units subcu injection daily.  11.Januvia 50 mg p.o. daily.  12.Bactrim DS 160/800 mg p.o. b.i.d. x3 days.  13.KCl 10 mg p.o. daily.  14.Nitroglycerin 0.4 mg sublingual p.r.n. chest pain.   DURATION OF DISCHARGE ENCOUNTER INCLUDING PHYSICIAN TIME:  35 minutes.      Jarrett Ables, [facility]      Verne Carrow, MD  Electronically Signed    MS/MEDQ  D:  07/02/2009  T:  07/03/2009  Job:  [id number]   cc:   Loraine Leriche A. Perini, M.D.

## 2011-04-28 NOTE — Discharge Summary (Signed)
[name], [name] NO.:  [id number]   MEDICAL RECORD NO.:  [id number]          PATIENT TYPE:  INP   LOCATION:  6526                         FACILITY:  [facility]   PHYSICIAN:  Jesse Sans. Wall, MD, FACCDATE OF BIRTH:  [birth year]   DATE OF ADMISSION:  11/12/2008  DATE OF DISCHARGE:  11/16/2008                               DISCHARGE SUMMARY   ADDENDUM   PRIMARY CARDIOLOGIST:  Maisie Fus C. Daleen Squibb, MD, [facility]   PRIMARY CARE PHYSICIAN:  Mark A. Perini, MD   The patient was also discharged on aspirin 81 mg every other day.  This  was not added to the discharge summary and this is an error.  Please add  this to the discharge summary as the patient will be on aspirin 81 mg  every other day.      Bettey Mare. Lyman Bishop, NP      Jesse Sans. Daleen Squibb, MD, [facility]  Electronically Signed    KML/MEDQ  D:  12/06/2008  T:  12/06/2008  Job:  [id number]

## 2011-05-01 NOTE — Consult Note (Signed)
[facility]  Patient:    [name], [name] Visit Number: [id number] MRN: [id number]          Service Type: EMS Location: ED Attending Physician:  Donnetta Hutching Dictated by:   Genene Churn. Love, M.D. Proc. Date: 01/28/02 Admit Date:  01/28/2002 Discharge Date: 01/28/2002                            Consultation Report  PATIENT ADDRESS:  [address], [place], [place] [address]  REFERRING PHYSICIAN:  ER doctor, Dr. Josie Saunders.  REASON FOR CONSULTATION:  This 77 year old right-handed white widowed female who lives alone is seen in the emergency room for evaluation of recurrent left leg and hand and arm numbness.  HISTORY OF PRESENT ILLNESS:  [name] has a known history of diabetes mellitus for approximately 20 to 30 years, and a 10 year history of high blood pressure.  She has a one month history of recurrent episodes of left foot numbness extending to her knee, and left hand numbness extending to her elbow, usually lasting minutes or up to hours.  She has been on Ascriptin, and about three weeks ago was placed on Plavix with seemingly resolution of symptoms, but developed dark urine without associated hematuria, purpura, or blood in her stool, and this was discontinued.  Recently, she has had recurrent episodes of numbness, and talked with Dr. Timothy Lasso today by telephone, who recommended that she come to the emergency room.  She has had episodes of numbness today beginning in her foot extending to her knee, and in her hand extending to her elbow, as often as every 25 to 30 minutes.  This is the first time that it has occurred this week, and she became concerned and came to the emergency room.  There was no associated headache, syncope, seizure, chest pain, or palpitations.  She has had Doppler study of the carotids as an outpatient which were unremarkable, and in the emergency room she had a CAT scan of the brain which in my eyes showed mild diffuse  atrophy, chest x-ray with chronic obstructive pulmonary disease, and an EKG with normal sinus rhythm, could not rule out anterior infarct, age indeterminate.  Normal hemoglobin and hematocrit, and normal basic metabolic panel.  Normal CK, CK-MB of 39, and negative troponin.  The magnesium was 1.9.  PAST MEDICAL HISTORY: 1. Diabetes mellitus. 2. Hypertension. 3. Left femur fracture. 4. Appendectomy. 5. Hysterectomy. 6. She has had an irregular heart beat when she had diverticulitis.  SOCIAL HISTORY:  She was educated through seventh grade in school.  ALLERGIES:  PENICILLIN, causing swelling.  CURRENT MEDICATIONS: 1. Glucophage 400 mg b.i.d. 2. Amaryl 5 mg q.d. 3. Atenolol 50 mg b.i.d. 4. Synthroid, dosage unknown, one q.d. 5. Caltrate 600 mg with vitamin D b.i.d. 6. Ascriptin one q.d. 7. A new cholesterol lowering medication. 8. Plavix which had been discontinued after about one week.  HABITS:  She does not drink alcohol or smoke cigarettes.  PHYSICAL EXAMINATION:  GENERAL:  Well-developed, thin, somewhat kyphotic white female.  VITAL SIGNS:  Blood pressure in right and left arm of 140/80, heart rate 84.  NECK:  There were no bruits.  NEUROLOGICAL EXAMINATION:  She was alert and oriented x3.  Cranial nerve examination revealed the left pupil larger than the right.  Visual fields were full.  Disks flat.  Extraocular movements full.  No seventh nerve palsy. Hearing decreased.  Air conduction greater than bone conduction.  Tongue  midline, uvula midline, gags present.  Sternocleidomastoid and trapezius testing normal motor examination, 5/5 strength in the upper and lower extremities.  Coordination with finger-to-nose, heel-to-shin, rapid alternating movements intact.  Sensory examination intact to pinprick, touch, vibration.  Deep tendon reflexes 2+, no ankle jerks.  Plantar responses downgoing.  Gait examination was steady, she did use a cane.  IMPRESSION: 1. Right brain  transient ischemic attack, code 435.9.  Suspect thalamic small    vessel ischemic disease. 2. Diabetes mellitus, code 250.60. 3. Hypertension.  PLAN:  Restart Plavix, and consider Aggrenox if urine discoloration occurs again.  If she continues to have symptoms, I would recommend an MRA of the vessels for consideration of Coumadin therapy, in addition to the aspirin, though I am hesitant to use that medication because of her age, and the lack of date to indicate that it is actually better then what she is currently using. Dictated by:   Genene Churn. Love, M.D. Attending Physician:  Donnetta Hutching DD:  01/28/02 TD:  01/29/02 Job: [id number]

## 2011-05-01 NOTE — Discharge Summary (Signed)
[place]  Patient:    DOLORA, RIDGELY                     MRN: [id number] Adm. Date:  [id number] Disc. Date: 06/10/01 Attending:  Faith Rogue T Dictator:   Mcarthur Rossetti. Angiulli, P.A. CC:         Faith Rogue, M.D.  Kerrin Champagne, M.D.  Rodrigo Ran, M.D.   Discharge Summary  DISCHARGE DIAGNOSES: 1. Left intertrochanteric hip fracture. 2. Status post closed reduction internal fixation May 23, 2001. 3. Postoperative anemia. 4. Diabetes mellitus. 5. Hypertension. 6. Hypothyroidism. 7. Irregular heartbeat.  HISTORY OF PRESENT ILLNESS:  This is an 77 year old white female admitted to [facility] May 23, 2001, after a fall, without loss of consciousness, sustaining a left intertrochanteric hip fracture.  Underwent closed reduction internal fixation with compression screw May 23, 2001, per Dr. Otelia Sergeant.  Placed on aspirin therapy daily.  Advised touchdown weightbearing. Postoperative anemia.  She was transfused.  No chest pain or shortness of breath.  Hospital course uneventful.  She was moderate assist for bed mobility, moderate assist for transfers.  Latest chemistries unremarkable with a hemoglobin 8.7.  Chest x-ray negative.  She did receive a cranial CT scan of the head from her fall resulting in her left hip fracture that showed no acute changes.  She was admitted for comprehensive rehabilitation program.  PAST MEDICAL HISTORY:  See discharge diagnoses.  PAST SURGICAL HISTORY: 1. Appendectomy. 2. Hysterectomy.  ALLERGIES:  PENICILLIN.  PRIMARY M.D.:  Dr. Waynard Edwards.  ALCOHOL/TOBACCO:  No alcohol or tobacco.  MEDICATIONS PRIOR TO ADMISSION:  Synthroid.  Glucophage.  Atenolol. Ascriptin.  Amaryl.  SOCIAL HISTORY:  Lives alone.  Independent prior to admission.  One-level home, one step to entry.  Plans to stay with sister or daughter on discharge, of which they have three steps to the entry of their home.  HOSPITAL  COURSE:  The patient did well while on subacute rehabilitation services with therapies initiated on a b.i.d. basis.  The following issues were followed during the patients rehabilitation course.  Pertaining to Ms. Glidewells left intertrochanteric hip fracture, remained stable. Surgical site healing nicely.  No signs of infection.  She remained touchdown weightbearing.  She would follow up with Dr. Otelia Sergeant.  Of note, upon rehabilitation services she was on aspirin therapy as prior to hospital admission.  She was later placed on subcutaneous Lovenox for deep vein thrombosis prophylaxis upon her admission to rehabilitation services.  She also received a venous Doppler study of the lower extremities showing no signs of deep vein thrombosis.  She would continue her aspirin therapy upon discharge.  Postoperative anemia was stable with latest hemoglobin 14.1, hematocrit 40.9.  Her blood sugars were controlled, with blood sugars 110, 125.  She continued on her Amaryl and Glucophage.  Blood pressures remained controlled on Tenormin.  There were no orthostatic changes.  She would continue her hormone supplement for her hypothyroidism.  She had no chest pain or shortness of breath throughout her rehabilitation course.  No bowel or bladder disturbances.  Overall, for her functional mobility, she was supervision transfers as well as ambulation.  Stairs were attempted, but she refused because she had stated that the emergency medical services would carry her into the ______ ; nonetheless, she was occasional minimal assistance for stairs.  Minimal assist for toilet transfers.  The plan was for home health physical and occupational therapy, as well as a home health aide.  Latest laboratories showed a sodium 137, potassium 4.5, BUN 11, creatinine 0.5, hemoglobin 14.1, hematocrit 40.9.  DISCHARGE MEDICATIONS: 1. Tenormin 100 mg daily. 2. Amaryl 4 mg daily. 3. Synthroid 112 mcg daily. 4. Glucophage 850 mg  twice daily. 5. Ascriptin 1 daily as prior to hospital admission. 6. Tylox as needed for pain.  ACTIVITY:  Touchdown weightbearing with walker.  DIET:  Diet was 1800 calorie ADA.  WOUND CARE:  Cleanse incision daily with warm water and soap.  FOLLOW-UP:  Follow up with Dr. Otelia Sergeant, orthopedic services.  Call for an appointment with Dr. Rodrigo Ran, medical management. DD:  06/08/01 TD:  06/08/01 Job: [id number]

## 2011-05-01 NOTE — H&P (Signed)
[facility]  Patient:    [name], [name]                     MRN: [id number] Adm. Date:  [id number] Attending:  Devoria Albe Dictator:   Dorie Rank, P.A. CC:         Rodrigo Ran, M.D.   History and Physical  CHIEF COMPLAINT:  Left hip pain.  HISTORY OF PRESENT ILLNESS:  Approximately 10:30 a.m. this morning, [name] was walking in sandals when she tripped and landed on her left hip.  She denies any loss of consciousness.  Since that time, she did have extreme left pain with any type of movement to the left hip.  Ambulance transported her to [facility] where x-rays revealed a left intertrochanteric fracture.  Dr. Kerrin Champagne was on [facility] orthopedic call and was notified of the patients admission to the ER.  PAST MEDICAL HISTORY:  Significant for diabetes mellitus controlled with p.o. medications, hypertension, hypothyroidism.  She says she has a history of an irregular heart beat and was placed on Lanoxin approximately 15 years ago. She was seen by Dr. Maisie Fus C. Wall, a cardiologist, in March of this year who evaluated her and obtained an echocardiogram which was normal in March of 2002.  He felt like there was no need for her to be on the Lanoxin and discontinued this.  She has had no arrhythmias that she knows of since that time.  PAST SURGICAL HISTORY:  Appendectomy times about 45 years ago.  History of hysterectomy, unsure of when.  MEDICATIONS:  Synthroid, Glucophage, atenolol, Ascriptin p.r.n.  She takes one more medication for her diabetes but she is unsure of the name.  Her daughter is to bring in her home dosages of all these medications to be confirmed.  ALLERGIES:  PENICILLIN causes edema to the arm at the injection site.  SOCIAL HISTORY:  She lives alone.  She is a nonsmoker, nondrinker.  Her daughter is present with her today.  FAMILY HISTORY:  Noncontributory.  REVIEW OF  SYSTEMS:  GENERAL:  No fevers, chills, night sweats or bleeding tendencies.  PULMONARY:  No shortness of breath, productive cough or orthopnea.  CARDIOVASCULAR:  No chest pain, angina.  GI:  No nausea, vomiting, diarrhea, constipation, melena or bloody stools.  GU:  No dysuria, hematuria or discharge.  MUSCULOSKELETAL:  Left hip pain as described above in history of present illness.  PHYSICAL EXAMINATION:  GENERAL:  [age]-year-old white female, well-developed, well-nourished, alert and oriented x 3.  VITAL SIGNS:  Pulse 87, respirations 24, temperature 98.4, blood pressure 198/83.  NECK:  Supple.  Negative for carotid bruits bilaterally.  CHEST:  Lungs are clear to auscultation bilaterally.  No wheezes, rhonchi nor rales.  BREASTS:  Not pertinent to present illness.  HEART:  S1 and S2 negative for murmur, rub or gallop.  Heart is regular in rate and and rhythm.  ABDOMEN:  Soft, nontender.  Positive bowel sounds.  GU:  Not pertinent to present illness.  EXTREMITIES:  Dorsalis pedis pulses 2+ bilaterally, 2+ posterior tibialis pulses bilaterally.  There is external rotation to the left foot.  She has pain with any type of active or passive range of motion to the left hip.  SKIN:  Intact.  No contusions or abrasions noted.  NEUROLOGIC:  EOMs intact, PERRLA.  X-RAY DATA:  Left hip x-ray revealed intertrochanteric hip fracture.  Chest x-ray revealed COPD with no active disease.  CT head was negative.  IMPRESSION: 1. Left intertrochanteric hip fracture. 2. Diabetes mellitus. 3. Hypertension. 4. Hypothyroidism.  PLAN:  She is to be admitted to [facility] for surgical intervention of an open reduction and internal fixation to the left hip with compression screw and sideplate. DD:  05/23/01 TD:  05/23/01 Job: [id number]

## 2011-05-01 NOTE — Consult Note (Signed)
NAMETIMEA, BREED NO.:  [id number]   MEDICAL RECORD NO.:  [id number]          PATIENT TYPE:  INP   LOCATION:  4713                         FACILITY:  [facility]   PHYSICIAN:  Veverly Fells. Excell Seltzer, MD  DATE OF BIRTH:  05/02/1913   DATE OF CONSULTATION:  DATE OF DISCHARGE:                                 CONSULTATION   [facility] CONSULT NOTE   PRIMARY CARE PHYSICIAN:  Dr. Waynard Edwards.   PRIMARY CARDIOLOGIST:  Dr. Juanito Doom.   REASON FOR CONSULTATION:  Back, neck and epigastric pain.   HISTORY OF PRESENT ILLNESS:  [name] is a 77 year old woman with  no prior history of coronary artery disease who developed mid scapular  back pain yesterday afternoon around 2 p.m.  The pain radiated to the  abdomen and went up into the neck.  She complained of associated nausea.  There was no shortness of breath, vomiting or diaphoresis.  Some of the  pains in the neck felt like sharp electrical pains.  She denies any  chest pain.  The symptom onset was with cooking and improved with rest.  She received nitroglycerin and aspirin in the emergency room and her  symptoms resolved at that point.  She is now chest pain free and has had  no chest pain at all today.   MEDICATIONS IN THE HOSPITAL:  Include:  1. A heparin drip.  2. Aspirin 325 mg daily.  3. Protonix 40 mg daily.  4. Sliding scale insulin.  5. Atenolol 50 mg in the morning and 100 mg in the evening.  6. Synthroid 112 mcg daily.  7. Lopid 600 mg b.i.d.   PAST MEDICAL HISTORY:  Pertinent for the following:  1. Essential hypertension.  2. Dyslipidemia.  3. Type 2 diabetes.  4. Hypothyroidism.  5. Left femur fracture after a fall.   SOCIAL HISTORY:  The patient lives alone in [place].  She has been  widowed for approximately 10 years.  Her daughter lives nearby.  She  does not smoke cigarettes or drink alcohol and has no history of either.   FAMILY HISTORY:  Her mother died in her 83s and father died in  22s.  There is no coronary artery disease in the family.   REVIEW OF SYSTEMS:  A complete 12 point review of systems was performed.  The only pertinent positives included abdominal discomfort as detailed  above, as well as a problem with her balance (patient uses a cane).  All  other systems were reviewed and are negative, except as described.   PHYSICAL EXAMINATION:  GENERAL:  The patient is alert and oriented.  She  is a very sharp, elderly female in no acute distress.  VITAL SIGNS:  Her temperature 97.1.  Heart rate 63.  Respiratory rate is  18.  Blood pressure is 139/59.  Oxygen saturation is 98% on room air.  HEENT:  Normal.  NECK:  Normal carotid upstrokes with bilateral bruits, right greater  than left.  The jugular venous pressure is normal.  There is no  thyromegaly or thyroid nodules.  LUNGS:  Clear to auscultation bilaterally.  HEART:  On the apex is discrete and nondisplaced.  There is no right  ventricular heave or lift.  The heart is a regular rate and rhythm with  a 1/6 ejection murmur along the left sternal border.  There are no  gallops or diastolic murmurs.  ABDOMEN:  Soft, nontender.  No organomegaly.  No abdominal bruits.  BACK:  There is no CVA tenderness.  There is no paraspinal tenderness.  EXTREMITIES:  There is no clubbing, cyanosis or edema.  Peripheral  pulses are 1+ in the feet.  There are bilateral femoral bruits.  SKIN:  Warm and dry without rash.  LYMPHATICS:  There is no adenopathy.  NEUROLOGIC:  Alert and oriented to all spheres.  Cranial nerves II-XII  are intact.  Strength is 5/5 and equal in the arms and legs bilaterally.   Chest x-ray shows chronic changes with mild cardiac enlargement and no  acute pulmonary findings.  The mediastinal contours were within normal  limits.   EKG demonstrates sinus arrhythmia without significant ST or T-wave  changes.   LABORATORY DATA:  Shows negative cardiac biomarkers for 2 sets.   ASSESSMENT:  This is a  77 year old woman with an episode of back pain  radiating to the abdomen and neck that occurred yesterday.  While her  pain is somewhat atypical, it is certainly possible that this  represented myocardial ischemia.  Her pain was relieved by  nitroglycerin.  The differential diagnosis is broad and includes  gallbladder disease, aortic disease or a gastrointestinal etiology.  In  the absence of abnormality on chest x-ray, EKG or cardiac biomarkers, I  would be incline to observe the patient and certainly not pursue any  further  invasive workup.  I think it would be reasonable to check a 2D  echocardiogram to evaluate the patient's overall left ventricular  function.  I am reluctant to have Ms. Gotcher undergo a stress test  because she does not want to proceed with any invasive evaluation.  If  we were to do a stress test that was abnormal, we may be left with a  dilemma of how to proceed.  I would defer any stress testing at this  point, unless she has further pain episodes.   For medical therapy, I think it would be reasonable to continue her on  her high dose beta blocker as she is currently on a good dose of  atenolol with good heart rate control.  Would also recommend daily  aspirin 81 mg.   Thank you for the opportunity to see Ms. Koc.  We will continue to  follow her while she is here in the hospital and we will followup on her  echocardiogram results.      Veverly Fells. Excell Seltzer, MD  Electronically Signed     MDC/MEDQ  D:  02/07/2007  T:  02/08/2007  Job:  [id number]   cc:   Loraine Leriche A. Perini, M.D.  Thomas C. Wall, MD, [facility]

## 2011-05-01 NOTE — H&P (Signed)
[name], NYCE NO.:  [id number]   MEDICAL RECORD NO.:  [id number]          PATIENT TYPE:  EMS   LOCATION:  ED                           FACILITY:  [facility]   PHYSICIAN:  Barry Dienes. Eloise Harman, M.D.DATE OF BIRTH:  [birth year]   DATE OF ADMISSION:  02/06/2007  DATE OF DISCHARGE:                              HISTORY & PHYSICAL   CHIEF COMPLAINT:  Epigastric pain.   HISTORY OF PRESENT ILLNESS:  The patient is a 77 year old white female  who was in her usual state of good health until approximately 3 p.m.  today. At that time she developed low back pain that gradually involved  most of her back. The pain was diffuse and moderate in intensity and  became associated with epigastric and low substernal squeezing pain.  This was associated with mild nausea but no vomiting, and mild shortness  of breath. She denied recent fever or chills. She presented to the  emergency room for evaluation. In the emergency room she was given nasal  cannula oxygen, aspirin, and a nitroglycerin tablet. Gradually her  symptoms subsided. She has no history of coronary artery disease.   PAST MEDICAL HISTORY:  1. Hypertension.  2. Diabetes mellitus, type 2.  3. Hyperlipidemia.  4. Hypothyroidism.  5. An irregular heart beat.   MEDICATIONS PRIOR TO ADMISSION:  1. Atenolol 100 mg p.o. q.h.s., 50 mg p.o. q.a.m.  2. Synthroid 112 mcg p.o. daily.  3. Amaryl 4 mg p.o. q.a.m.  4. Lopid 600 mg p.o. b.i.d.  5. Metformin 1000 mg p.o. b.i.d.  6. Aspirin 81 mg p.o. daily.  7. Lantus insulin 4 units SQ q.h.s.   ALLERGIES:  PENICILLIN.   PAST SURGICAL HISTORY:  Appendectomy; hemorrhoidectomy; total abdominal  hysterectomy.  In 2002 a left femur fracture with open reduction and  internal fixation. No history of gallbladder surgery.   FAMILY HISTORY:  A brother died in his 64's of coronary artery disease.  There are several close family members that have had diabetes mellitus,  but none that have  had colon cancer or breast cancer.   SOCIAL HISTORY:  She has been a widow for approximately 9 years. She  lives by herself. A son lives in [place] as well as a daughter who  lives on the same street.  She has no history of alcohol or tobacco abuse.   REVIEW OF SYSTEMS:  Positive for mildly decreased hearing bilaterally.  Usual use of a single prong cane due to mild left hip and thigh pain  since her 2002 fracture. She also has symptoms suggestive of moderate  osteoarthritis of the left knee. She denies recent vision change, fever,  chills, cough, shortness of breath, constipation, vomiting, diarrhea,  anxiety or depression.   PHYSICAL EXAMINATION:  VITAL SIGNS: Blood pressure 133/49, pulse 64,  respirations 18, temperature 98, pulse oxygen saturation 97% on nasal  cannula oxygen.  GENERAL: She is an elderly white female who is in no apparent distress  while sitting partially upright on nasal cannula oxygen.  HEENT EXAM: Within normal limits.  NECK: Supple without jugular venous distension or carotid bruit.  CHEST: Bibasilar  crackles.  SPINE: The entire spine showed no tenderness to percussion. She did have  mild kyphosis.  HEART: Regular rate and rhythm without significant murmur or gallop.  ABDOMEN: Normal bowel sounds and no hepatosplenomegaly or tenderness.  EXTREMITIES: Without cyanosis, clubbing, or edema. The pedal pulses were  1+ bilaterally.  NEUROLOGICAL EXAM: She was alert and oriented x3 with mildly decreased  hearing bilaterally. She had sensate feet. She was able to move all  extremities well.   INITIAL LABORATORY STUDIES:  Serum sodium 137, potassium 4.2, chloride  103, CO2 23, BUN 20, creatinine 0.6, glucose 139, D-dimer 0.47 (less  than 048). CK-MB 2.5. Troponin-I less than 0.05.   EKG showed the following: Normal sinus rhythm, frequent PACs. A chest x-  ray report was pending at the time of dictation.   IMPRESSION AND PLAN:  1. Chest pain: Her symptoms are  somewhat atypical for coronary artery      disease. However, she has multiple risk factors for coronary      disease so this could be a somewhat atypical presentation for new      onset angina. A non Q wave myocardial infarction seems less likely      as does gastroesophageal reflux disease, gastritis or      cholelithiasis. I plan to check serial cardiac isoenzymes and      continue aspirin. Will start IV heparin per pharmacy protocol. In      addition, will check liver associated enzymes and use Protonix for      empiric gastritis treatment.  2. Diabetes mellitus, type 2:  For now we will hold Lantus, Amaryl,      and Glucophage and use sliding scale insulin as needed.  3. Dyslipidemia: Stable on Lopid treatment.           ______________________________  Barry Dienes. Eloise Harman, M.D.     DGP/MEDQ  D:  02/06/2007  T:  02/06/2007  Job:  [id number]   cc:   Loraine Leriche A. Perini, M.D.  Fax: [phone]

## 2011-05-01 NOTE — Consult Note (Signed)
[name], [name] NO.:  [id number]   MEDICAL RECORD NO.:  [id number]          PATIENT TYPE:  INP   LOCATION:  5156                         FACILITY:  [facility]   PHYSICIAN:  Maretta Bees. Vonita Moss, M.D.DATE OF BIRTH:  [birth year]   DATE OF CONSULTATION:  DATE OF DISCHARGE:                                 CONSULTATION   I was asked to see this lady for evaluation of right hydronephrosis and  ureteral stone found on ultrasound.  She is worked up for evaluation of  low back and mid scapular pain and then epigastric pain.  She did not  have discrete right flank pain.  She has had no hematuria or fever.  She  has a past history of a right renal calculus and right UPJ obstruction.   I have also seen her in the past for UTIs, atrophic vaginitis and  overactive bladder.  Last fall I have tried her on Enablex which she  apparently is not taking that any more.   PAST HISTORY:  1. Includes essential hypertension.  2. Diabetes.  3. Hypothyroidism.   ADMISSION MEDICATIONS:  Included atenolol, Synthroid, Lopid, metformin,  aspirin and Lantus insulin.   REVIEW OF SYSTEMS:  As noted on the health history form and initialed by  me.  She does not smoke or drink alcohol.  She is allergic to  PENICILLIN.   Blood pressure has been running in the range of 140/79, pulses in the  50s to 60s and she is afebrile.  She is alert and oriented.  Skin is  warm, dry, no acute distress.  She is a very tiny elderly white female,  appears stated age.  No acute distress.  No respiratory distress.  HEART:  Tones regular.  ABDOMEN:  Soft, nontender.   CT scan was done without contrast.  She is very large right renal pelvis  and 2 nonobstructing stones in the renal pelvis and what appears to be 7-  8 mm stone in the ureter at the level of the iliac crest.   IMPRESSION:  1. Right ureteropelvic junction obstruction and right hydronephrosis.  2. Right renal stones.  3. Right ureteral stone.  4.  Overactive bladder.  5. History of atrophic vaginitis.  6. History of urinary tract infections.   PLAN:  I am not sure whether her pain is related to the right ureteral  stones and hydronephrosis, but if she is cleared cardiac wise, it may be  appropriate next week to go ahead with cystoscopy and retrograde  pyelogram, double-J catheter and possible ureteroscopy.   FOLLOWUP:  I will follow this lady with you in the hospital.      Maretta Bees. Vonita Moss, M.D.  Electronically Signed     LJP/MEDQ  D:  02/08/2007  T:  02/09/2007  Job:  [id number]   cc:   Barry Dienes. Eloise Harman, M.D.  Veverly Fells. Excell Seltzer, MD

## 2011-05-01 NOTE — Op Note (Signed)
[facility]  Patient:    [name], [name]                     MRN: [id number] Proc. Date: 05/23/01 Adm. Date:  [id number] Attending:  Devoria Albe                           Operative Report  PREOPERATIVE DIAGNOSES:  Left intertrochanteric hip fracture, three part type fracture pattern with lesser trochanter fracture although minimally displaced.  POSTOPERATIVE DIAGNOSES:  Left intertrochanteric hip fracture, three part type fracture pattern with lesser trochanter fracture although minimally displaced.  PROCEDURE:  Closed reduction internal fixation of left intertrochanteric hip fracture using a 130 degree five hole Omega plate with an 80 mm compression screw.  SURGEON:  Dr. Vira Browns.  ASSISTANT:  Ralene Bathe, P.A.-C.  ANESTHESIA:  Spinal, Dr. Shireen Quan.  ESTIMATED BLOOD LOSS:  150 cc.  DRAINS:  Foley to straight drain.  BRIEF CLINICAL HISTORY:  An 77 year old female fell today when walking to her neighbors house to help with her neighbors convalescence from a hip fracture. She apparently fell catching her toe on a stoop outside the front door. She landed sustaining a left intertroch hip fracture. Seen in the emergency room, evaluated, brought to the operating room for a closure reduction versus open reduction internal fixation of the left intertroch hip fracture. She has a history of diabetes, otherwise no other major risk factors.  INTRAOPERATIVE FINDINGS:  Left intertroch hip fracture. The head and neck with the large fragment proximally. There was fragmentation of the lesser trochanter which was minimally displaced. Some extension into the subtrochanteric region from the lesser trochanter.  DESCRIPTION OF PROCEDURE:  After adequate spinal anesthesia, the patient transferred to the chick fracture table. The left lower extremity placed in longitudinal traction after padding the ankle and foot to prevent skin injury. The cuff about the left  ankle inflated to 80 mmHg. A post was used, the right leg placed in well traction device. The left lower extremity placed in longitudinal traction despite internal rotation. C-arm fluoro used to ascertain a position alignment of the fracture site and appeared to reduce nicely with pure longitudinal traction and internal rotation of 15 to 20 degrees. Standard prep with duraprep solution over the left lateral hip to the lower rib margin down to the left knee. Draped in the usual manner. Iodine impregnated exclusion Vi-drape was used. The incision extending from the left greater trochanter distally in line with the lateral aspect of the femur approximately 20 cm in length through the skin and subcu layers down to the tensor fascia lata. The tensor fascia lata incised in line with the skin incision and then spread. The vastus lateralis retracted anteriorly and then incision made along the posterior aspect of the vastus lateralis preserving the cuff for later reattachment. Cobb elevator then used to elevate the vastus lateralis off the lateral aspect of the femur and Bennett retractor inserted. A blunt pin was placed over the anterior aspect of the left hip and this was placed in line with the femoral neck and head. A 130 degree angled guide was used to introduce a pin into the lateral aspect of the proximal femur opposite the lesser trochanter in line with the previous guidepin. The guide used with a 130 degree angled guide and the pin was passed through the mid portion of the neck into the mid portion of the head on both the AP and  lateral views. This was placed in the subchondral bone of the femoral head. Observed on both the AP and lateral view to be in good position alignment. This was measured for length and measured 95 mm. An 85 mm length of step cut reaming was performed over this guidepin after it was tapped in slightly. The guidepin remained in place and an 80 mm screw was then inserted  and screwed into place at about 5-10 mm below subchondral bone. This was observed on the AP and lateral view to be in good position alignment. Next, the five hole 130 degree side plate was placed over the shank of the screw and driven into place. Note that the shank had been aligned properly with the lateral aspect of the femoral shaft. This was then impacted against the lateral aspect of the left proximal femur. Next, a Lowman clamp was used to hold the plate in place and drill holes were performed over the five holes of the plate over the lateral femoral cortex using the appropriate size drill bit and then self tapping screws were placed. Each of the screw holes were individually drilled, measured for depth and the appropriate size screw placed. The 36 mm length screws were used on nearly each level. When this was completed, longitudinal traction was released on the left lower extremity and compression screw inserted that allowed for compressing of the fracture site over the lateral aspect of the plate. This was done without difficulty. The fracture line nicely compressed on C-arm fluoro. When this was completed, irrigation was performed. There was a small perforating blood vessel that was controlled at the beginning of the case over the posterior aspect of the incision line. It represented a small blood vessel and was easy to cauterize. The patient had been on aspirin. She had oozing throughout the case without definite bleeders present. Next the vastus lateralis superficial fascial layers was approximated with a running stitch of #0 Vicryl suture. The tensor fascia lata was approximated with a running stitch of #1 Vicryl suture. The deep subcu layer was approximated with #0 Vicryl sutures, the more superficial layers with interrupted 2-0 Vicryl suture and the skin closed with stainless steel staples. Adaptic, 4 x 4s, ABD pad then affixed to the skin with hypofix tape. The patient was  then transferred to her bed and returned to the recovery room in satisfactory condition. Note that permanent C-arm images were obtained for documentation purposes. At the end of the case, all instrument and sponge  counts were correct. DD:  05/23/01 TD:  05/23/01 Job: [id number]

## 2011-05-01 NOTE — Op Note (Signed)
NAMESAN, RUA NO.:  [id number]   MEDICAL RECORD NO.:  [id number]          PATIENT TYPE:  AMB   LOCATION:  [facility]                         [facility]ACILITY:  [facility]   PHYSICIAN:  Maretta Bees. Vonita Moss, M.D.DATE OF BIRTH:  1913-07-08   DATE OF PROCEDURE:  03/07/2007  DATE OF DISCHARGE:                               OPERATIVE REPORT   PREOPERATIVE DIAGNOSES:  1. Right atypical ureteropelvic junction.  2. Rule out hydroureteronephrosis/  3. Rule out ureteral calculus.   POSTOPERATIVE DIAGNOSIS:  Atypical right ureteropelvic junction.   PROCEDURE:  Cystoscopy and right retrograde pyelogram with  interpretation.   SURGEON:  Maretta Bees. Vonita Moss, M.D.   ANESTHESIA:  General.   INDICATIONS:  This lady was hospitalized recently with abdominal pain  and was brought to the O.R. today to rule out any pathologic obstruction  or problems with the right urinary tract.  There was a large  calcification on an x-ray that could not be ruled out of the ureter, and  also she has had a known capacious renal pelvis felt to be due to an  atypical UPJ.  She is brought to the O.R. today to rule out obstruction  and rule out a ureteral stone.   PROCEDURE:  The patient was brought to the operating room and placed in  the lithotomy position.  External genitalia were prepped and draped in  the usual fashion.  She was cystoscoped, and the bladder was perfectly  normal.   By utilizing a cone-tip catheter inserted through the cystoscope, a  right retrograde pyelogram was performed by injection of contrast. The  right ureter was unobstructed.  It curved medial to this abdominal  calcification at the level of L5-S1, and the calcification was obviously  outside the ureter.  She had a large, capacious renal pelvis, with  delicate calices, and no intrarenal obstruction.   The bladder was emptied, the scope removed, and the patient went to  recovery room in good condition, having tolerated the  procedure well.      Maretta Bees. Vonita Moss, M.D.  Electronically Signed    LJP/MEDQ  D:  03/07/2007  T:  03/07/2007  Job:  [id number]   cc:   Loraine Leriche A. Perini, M.D.  Fax: [phone]

## 2011-05-01 NOTE — Op Note (Signed)
NAME:  [name], [name] NO.:  [id number]   MEDICAL RECORD NO.:  [id number]                   PATIENT TYPE:  AMB   LOCATION:  [facility]                                 [facility]ACILITY:  [facility]   PHYSICIAN:  Maretta Bees. Vonita Moss, M.D.             DATE OF BIRTH:  25-Jul-1913   DATE OF PROCEDURE:  DATE OF DISCHARGE:                                 OPERATIVE REPORT   PREOPERATIVE DIAGNOSIS:  Right hydronephrosis, rule out right ureteral  calculus, right renal calculus.  Rule out transitional cell carcinoma of  renal pelvis or ureter.   POSTOPERATIVE DIAGNOSIS:  Right hydronephrosis.  Chronic right ureteropelvic  junction obstruction with right renal calculus.   PROCEDURE:  Cystoscopy, right retrograde pyelogram with interpretation,  right ureteroscopy, and right double-J catheter insertion.   SURGEON:  Maretta Bees. Vonita Moss, M.D.   ANESTHESIA:  General.   INDICATIONS:  This 77 year old white female had dark urine suspected for  being hematuria with some mild low back pain.  Renal ultrasound showed right  hydronephrosis and a 4 mm stone in the right renal pelvis.  Cystoscopy  revealed no abnormalities but brownish-colored urine coming from the right  ureteral orifice.  CT scan showed moderate right hydronephrosis and a 5 mm  stone in the lower pole of the right kidney with a question of another stone  in the right renal pelvis and what looked like a chronic UPJ obstruction.  The CT could not rule out an 8 mm stone near the proximal right ureter but  suspect it could be a calcified lymph node.  After cardiac clearance, she  was brought to the OR today for further evaluation.   PROCEDURE:  The patient was brought to the operating room and placed in the  lithotomy position.  She was prepped and draped in the usual fashion.  Cystoscopy revealed a very normal bladder.  Right ureteral orifice was  catheterized with a guidewire and then a right retrograde pyelogram was  obtained  using the open-ended guidewire.  She had a very high insertion of a  UPJ obstruction.  Initial films showed fairly delicate pyelocalyceal system  but then with further injection, she did have some distention of the  pyelocalyceal system.  I could not tell whether there was a ureteral stone  or not, so after inserting a ureteral dilating sheath and using flexible  ureteroscope, the calcification seen on x-ray was obviously not in the  ureter and was not a ureteral calculus.  I was able to negotiate the super-  soft, flexible ureteroscope into the renal pelvis through this high  insertion.  No renal tumors were seen.  There was some old blood clot  visualized, but I never did see a stone, which may be trapped out in the  calyx at this time.  I felt that any stones in the renal pelvis would  probably unlikely be able to traverse the right UPJ with its high  insertion,  so I felt it was not necessary to aggressively try and locate and treat this  stone at this point.  Having ruled  out any ureteral calculi and any other serious abnormalities, I removed the  ureteroscope and inserted a 6 French 24 cm double-J catheter with a full  coil in the renal pelvis and full coil in the bladder and a string brought  out through her urethra.  She was then taken to the recovery room in good  condition, having tolerated the procedure well.                                               Maretta Bees. Vonita Moss, M.D.    LJP/MEDQ  D:  04/23/2004  T:  04/23/2004  Job:  [id number]   cc:   Olga Millers, M.D. [facility] A. Waynard Edwards, M.D.  [address]  [place]  [place] [address]  Fax: [phone]

## 2011-05-01 NOTE — Discharge Summary (Signed)
[facility]  Patient:    [name], [name]                     MRN: [id number] Adm. Date:  [id number] Disc. Date: 05/26/01 Attending:  Devoria Albe Dictator:   Alexzandrew L. Julien Girt, P.A.-C. CC:         Rodrigo Ran, M.D.  Faith Rogue, M.D.   Discharge Summary  ADMISSION DIAGNOSES: 1. Left intertrochanteric hip fracture. 2. Hypertension. 3. History of cardiac arrhythmia. 4. History of hepatitis in 1984. 5. Type 2 diabetes mellitus. 6. Hypothyroidism. 7. Psoriasis.  DISCHARGE DIAGNOSES:  1. Left intertrochanteric hip fracture.  2. Closed reduction and internal fixation of the left hip.  3. Postoperative hemorrhagic anemia.  4. Status post transfusion.  5. Postoperative hyponatremia, improved.  6. Postoperative hyperglycemia, improved with sliding scale.  7. Hypertension.  8. History of cardiac arrhythmia.  9. History of hepatitis in 1984. 10. Type 2 diabetes mellitus. 11. Hypothyroidism. 12. Psoriasis.  PROCEDURES:  The patient was taken to the OR on May 23, 2001, and underwent a closed reduction and internal fixation of left intertrochanteric hip fracture. Surgeon:  Kerrin Champagne, M.D.  Assistant:  Ralene Bathe, P.A.  Surgery done under spinal anesthesia per Lestine Box, M.D.  CONSULTS:  Rehabilitation services, Faith Rogue, M.D.  BRIEF HISTORY:  The patient is an 77 year old female who unfortunately fell and injured her left hip on the date of admission.  She was transported to the emergency department where x-rays revealed a left intertrochanteric hip fracture.  She was admitted and taken to the operating room for the above-stated procedure.  LABORATORY DATA:  The CBC on admission showed a hemoglobin of 13.9, a hematocrit of 40.2, and a white blood cell count of 13.5.  Serial H&Hs were followed throughout the hospital course.  The hemoglobin unfortunately dropped down to a level of 9.3 and continued to decline to a level  of 8.7.  It was noted that she became symptomatic.  She was transfused with two units of blood.  Transfusion ongoing at the time of dictation.  Hemoglobin pending. The PT and PTT on admission were 13.5 and 25, respectively with an INR of 1.1. The chemistry panel on admission showed a slightly elevated glucose of 118. The remaining chemistry panel was all within normal limits.  The sodium did drop postoperatively down from 138 to 130.  However, it was back up to 135 prior to discharge.  The patients glucose did increase from 118 to 217.  She was placed on a sliding scale.  The glucose was improving.  It was back down to 157.  The urinalysis on admission revealed small leukocyte esterase with only 0-3 white cells and 40 ketones, otherwise negative.  The chest x-ray on the day of admission of May 23, 2001, revealed no evidence of acute cardiac or pulmonary process.  Both lungs were hyperinflated consistent with an element of COPD.  Left hip films on admission showed a left intertrochanteric femoral fracture.  The pelvic film showed an intertrochanteric left fracture.  On CT of the head without contrast on May 23, 2001, there was no evidence of acute intracranial abnormality.  The EKG dated May 23, 2001, revealed normal sinus rhythm and cannot rule out old anterior infarct, age undetermined.  This was a confirmed EKG.  Unable to read the signature.  HOSPITAL COURSE:  The patient was admitted to [facility], taken to the OR, and underwent the above-stated procedure without complication.  The  patient tolerated the procedure well and later went to the recovery room and then to the orthopedic floor to continue postoperative care.  She was noted to have an elevated level of her CBGs.  She was started back on her oral hypoglycemics.  She did respond well.  However, due to the stress of the surgery and the therapy and the change in her diet, her sugar levels did remain high, requiring  her to be put onto a sliding scale during the hospital course.  She responded well to the sliding scale.  She does live alone. Rehabilitation services were consulted.  The patient was seen and evaluated by Faith Rogue, M.D., and felt that she would be an appropriate candidate for rehabilitation or SACU.  Unfortunately, rehabilitation was not an option due to her insurance company.  However, she was available to go to [place].  The patient waited for a bed to become available.  She continued with physical therapy.  She was up ambulating approximately 12 feet by postoperative day #2. She was slow to progress with therapy.  Dressing changes were initiated on postoperative day #2.  The incision was healing well.   Unfortunately her hemoglobin did drop postoperatively where she became symptomatic on the evening of postoperative day #2.  It was noted on postoperative day #3 that her hemoglobin had dropped again to 8.7.  She was transfused with two units of blood.  It was also noted that the patient did have a bed that became available on the SACU.  It was decided that she would be transferred once her blood was transfused.  DISCHARGE PLAN:  The patient was transferred to the [facility] Unit on May 26, 2001.  DISCHARGE MEDICATIONS:  The current medications at the time of transfer include aspirin 325 mg p.o. b.i.d., Amaryl 4 mg p.o. q.d. a.c., Glucophage 850 mg p.o. b.i.d., Synthroid 112 mcg p.o. q.d., Atenolol 50 mg p.o. q.d., Tylenol one or two every four to six hours as needed for pain p.r.n. pain, Restoril 15 mg p.o. q.h.s. p.r.n. sleep, Reglan 10 mg p.o. q.6-8h. p.r.n. nausea, Robaxin 500 mg p.o. q.6-8h. p.r.n. spasm, and Percocet one to two every four to six hours as needed for pain.  She is on an sliding scale insulin of Humalog.  DIET:  A 2000 calorie, ADA, low-sodium diet.  ACTIVITY:  She is touchdown weightbearing.  She will continue with  gait  training ambulation as per physical therapy with occupational therapy for ADLs.  FOLLOW-UP:  The patient is to follow up with Kerrin Champagne, M.D., in two weeks or following discharge from the [facility].  DISPOSITION:  [place].  CONDITION ON DISCHARGE:  Improving.  LABORATORY DATA PENDING:  The hemoglobin and hematocrit are pending due to the fact that she was receiving blood at the time of dictation and also transfer. Recommend follow-up H&H after blood is completed. DD:  05/26/01 TD:  05/26/01 Job: [id number]

## 2011-05-01 NOTE — Discharge Summary (Signed)
[name] [name] NO.:  [id number]   MEDICAL RECORD NO.:  [id number]          PATIENT TYPE:  INP   LOCATION:  5156                         FACILITY:  [facility]   PHYSICIAN:  Mark A. Perini, M.D.   DATE OF BIRTH:  06-Dec-1913   DATE OF ADMISSION:  02/07/2007  DATE OF DISCHARGE:  02/10/2007                               DISCHARGE SUMMARY   DISCHARGE DIAGNOSES:  1. 7.8 mm obstructing right ureteral kidney stone which I believe has      caused her back pain which radiated around anteriorly this      admission.  2. Type 2 diabetes.  3. Anemia of chronic disease.  4. Dyslipidemia.  5. Atypical chest pain with no definite evidence of cardiac ischemia      this admission.  6. Urinary tract infection.  Final culture pending.  7. Chronic pancreatitis seen on CAT scan with normal amylase and      lipase with no clinical symptoms to suggest this.  8. Possible left pelvic vein thrombosis seen on CAT scan. She is a      very high fall risk and has been will not be given Coumadin at this      point in time.  9. Severe osteoporosis.  10.Gait instability.  11.Skin care to the dorsal aspect of the right hand.  12.Hypothyroidism.   PROCEDURES:  1. CT scan of the abdomen and pelvis without IV contrast which showed      the above findings.  She also had mild pulmonary fibrosis and some      diverticulosis without evidence of diverticulitis.  2. Urology consult and cardiology consult.  3. 2-D echocardiogram showing normal LV function with ejection      fraction of 55-60% with no significant valvular dysfunction.  4. Abdominal ultrasound which showed a right hydroureter and the      probable right kidney stone.   DISCHARGE MEDICATIONS:  1. Atenolol 50 mg each morning and 1 mg each evening.  2. Aspirin 81 mg daily.  3. Synthroid 112 mcg daily.  4. Amaryl 4 mg daily.  5. Lopid 600 mg twice a day with food.  6. Metformin one half of 1000 mg pill twice a day with food.  She  is      to check her blood sugars twice daily before breakfast and supper      and keep a record.  7. She is to resume her Lantus insulin shot 4 units subcutaneous each      morning.  8. Cipro 250 mg twice daily for five further days.  9. Tylenol 500 mg 1-2 tablets every 8 hours as needed for pain.   HISTORY OF PRESENT ILLNESS:  [name] is a pleasant 77 year old female who  lives independently. She presented with low back pain that was diffuse  that then radiated to her epigastric area and then she developed neck  pain.  She had some nausea as well.  She denied any fevers or chills.  She was admitted for further evaluation.   HOSPITAL COURSE:  [name] [name] remained stable from a cardiovascular  standpoint.  Telemetry showed  normal sinus rhythm.  She had negative  cardiac enzymes times multiple sets.  She had no hypoxia.  She underwent  a workup as noted above.  By February 10, 2007 she was deemed stable for  discharge home with further outpatient follow-up.   DISCHARGE PHYSICAL EXAM:  VITAL SIGNS:  Afebrile, temperature 97.8,  pulse 52, respiratory rate 16, blood pressure 139/63, 97% saturation on  room air. Blood sugars ranged in the mid 200s.  GENERAL:  She is in no acute distress.  Alert and oriented x4.  LUNGS:  Clear to auscultation bilaterally with no wheezes, rales or  rhonchi.  HEART:  Regular rate and rhythm with no murmur, rub or gallop.  ABDOMEN: Soft and nontender.  There was no edema.   DISCHARGE LABORATORY DATA:  White count 7.2 with normal differential,  hemoglobin 11.8, platelet count 141,000. Sodium 138, potassium 4.8,  chloride 102, CO2 27, BUN 26, creatinine 0.72, glucose 194, calcium 9.7   DISCHARGE INSTRUCTIONS:  [name] is to follow a low-salt diabetic diet.  She is to increase her activity slowly.  She is to call if she has any  recurrent problems.  She is to follow up with Loraine Leriche A. Perini, M.D. in 2  weeks and she is to follow up Dr. Vonita Moss in 3-4 days for  possible  outpatient procedure for her kidney stone.           ______________________________  Redge Gainer Waynard Edwards, M.D.     MAP/MEDQ  D:  02/10/2007  T:  02/10/2007  Job:  [id number]   cc:   Maretta Bees. Vonita Moss, M.D.  Veverly Fells. Excell Seltzer, MD

## 2011-06-17 ENCOUNTER — Other Ambulatory Visit: Payer: Self-pay | Admitting: Cardiovascular Disease

## 2011-08-19 ENCOUNTER — Other Ambulatory Visit: Payer: Self-pay | Admitting: Cardiology

## 2011-08-24 ENCOUNTER — Encounter (HOSPITAL_BASED_OUTPATIENT_CLINIC_OR_DEPARTMENT_OTHER): Payer: Medicare Other | Attending: Internal Medicine

## 2011-08-24 DIAGNOSIS — E039 Hypothyroidism, unspecified: Secondary | ICD-10-CM | POA: Insufficient documentation

## 2011-08-24 DIAGNOSIS — Z79899 Other long term (current) drug therapy: Secondary | ICD-10-CM | POA: Insufficient documentation

## 2011-08-24 DIAGNOSIS — M1990 Unspecified osteoarthritis, unspecified site: Secondary | ICD-10-CM | POA: Insufficient documentation

## 2011-08-24 DIAGNOSIS — E119 Type 2 diabetes mellitus without complications: Secondary | ICD-10-CM | POA: Insufficient documentation

## 2011-08-24 DIAGNOSIS — Z7982 Long term (current) use of aspirin: Secondary | ICD-10-CM | POA: Insufficient documentation

## 2011-08-24 DIAGNOSIS — R32 Unspecified urinary incontinence: Secondary | ICD-10-CM | POA: Insufficient documentation

## 2011-08-24 DIAGNOSIS — Z90710 Acquired absence of both cervix and uterus: Secondary | ICD-10-CM | POA: Insufficient documentation

## 2011-08-24 DIAGNOSIS — Z9089 Acquired absence of other organs: Secondary | ICD-10-CM | POA: Insufficient documentation

## 2011-08-24 DIAGNOSIS — Z85828 Personal history of other malignant neoplasm of skin: Secondary | ICD-10-CM | POA: Insufficient documentation

## 2011-08-24 DIAGNOSIS — I10 Essential (primary) hypertension: Secondary | ICD-10-CM | POA: Insufficient documentation

## 2011-08-24 DIAGNOSIS — M810 Age-related osteoporosis without current pathological fracture: Secondary | ICD-10-CM | POA: Insufficient documentation

## 2011-08-24 DIAGNOSIS — L97809 Non-pressure chronic ulcer of other part of unspecified lower leg with unspecified severity: Secondary | ICD-10-CM | POA: Insufficient documentation

## 2011-08-25 NOTE — Progress Notes (Unsigned)
[facility]  NAME:  [name], [name] NO.:  [id number]  MEDICAL RECORD NO.:  [id number]      DATE OF BIRTH:  1913-07-04  PHYSICIAN:  Jonelle Sports. Sevier, M.D.       VISIT DATE:                                  OFFICE VISIT   HISTORY:  This 77 year old white female is seen for evaluation and advice regarding a small ulcerated area on the posterior distal right lower extremity.  Apparently, the patient several months ago had a contusion in that area, which eventually somehow converted to a skin tear.  Since that time, she and her doctors have been treating this with various means, most recently to include Santyl with some improvement but with failure to heal.  She is here now for our evaluation and advice in that regard.  Apparently, at this point, the wound has very little drainage, certainly has no odor.  There is no redness reported by the patient's family nor is there any suggestion of systemic symptomatology.  There has been a bit of edema in that extremity and foot and they are simply concerned that the wound has not healed.  PAST MEDICAL HISTORY:  Complex, but not particularly contributory.  She has type 2 diabetes, which apparently is fairly well controlled; hypothyroidism; hypertension and bladder incontinence.  She also has a history of resolved hepatitis, trigeminal neuralgia, osteoporosis and degenerative joint disease, remote kidney stone and a previous squamous cell carcinoma of the skin.  PAST SURGICAL HISTORY:  Her past surgeries have included remote appendectomy, hysterectomy, hemorrhoidectomy, fixation of the hip fracture and cataract surgeries as well as removal of an ingrown toenail on the right great toe some 3 months ago.  She has had no problems of healing with those procedures.  ALLERGIES:  She said to be allergic to: 1. PENICILLIN, which causes a rash. 2. CODEINE, which causes nausea and vomiting. 3. Question to  CIPRO and BACTRIM.  MEDICATIONS:  Her regular medications include: 1. P.r.n. Tylenol for discomfort. 2. Aspirin 81 mg 3 times a week. 3. Atenolol 25 mg b.i.d. 4. Gemfibrozil 600 mg b.i.d. 5. Metformin 500 mg b.i.d. 6. Klor-Con 10 mEq twice weekly. 7. Lantus insulin 18 units in the morning. 8. Januvia 50 mg daily. 9. Synthroid 112 mcg daily. 10.Protonix 40 mg b.i.d. 11.Amlodipine 5 mg daily. 12.Lasix 10 mg daily. 13.Nitrostat p.r.n. 14.Multiple vitamin 1 daily. 15.Caltrate 1 daily. 16.B12 500 mg daily. 17.Prolia 60 mg every 6 months. 18.Alprazolam 0.25 mg as needed. 19.Bactroban 2% ointment and Santyl ointment, the latter two of which     she has used on this wound.  PHYSICAL EXAMINATION:  GENERAL:  On examination today, she is a remarkable, youthful, alert, cooperative elderly female in no immediate distress. VITAL SIGNS:  Her blood pressure is 167/61, pulse 52 and regular, respirations 18, temperature 97.8, random blood glucose 90. HEENT:  Her mucous membranes are moist and pink. CHEST:  Grossly clear. HEART:  Regular with no definite murmur or gallop. ABDOMEN:  Scaphoid without organomegaly, masses, or tenderness. EXTREMITIES:  Small amount of edema on the right lower extremity ankle and foot area.  Pulses are everywhere palpable, and she has normal ankle- brachial indices bilaterally.  On the posterior aspect of her distal right lower extremity, roughly overlying the  distal belly of the gastroc muscle, is a small ulcer measuring 0.7 x 0.6 x 0.1 cm with a very adherent fibrinous base.  There is no surrounding erythema.  Minimal tenderness.  No drainage.  No odor.  IMPRESSION:  Traumatic ulcer, posterior aspect of the distal right lower extremity, now stalled.  DISPOSITION: 1. The wound is sharply debrided under local 2% lidocaine of as much     of this fibrous and very adherent slough in the wound base that I     could safely and comfortably remove.  Following this,  she is to     return to the [facility] to be used on every-other-day basis.  We have     also recommended to her that she mix it with hydrogel so that the     enzymes are better activated.  She is to dress the wound with a     protective and absorbent-type dressing each time. 2. She is to change the dressing every 2 days at home. 3. Her followup visit will be here in 3 weeks, sooner should there be     problems or issues.          ______________________________ Jonelle Sports Cheryll Cockayne, M.D.     RES/MEDQ  D:  08/24/2011  T:  08/25/2011  Job:  [id number]

## 2011-09-08 LAB — URINALYSIS, ROUTINE W REFLEX MICROSCOPIC
Glucose, UA: NEGATIVE
Hgb urine dipstick: NEGATIVE
Protein, ur: NEGATIVE
Specific Gravity, Urine: 1.006
Urobilinogen, UA: 0.2

## 2011-09-08 LAB — DIFFERENTIAL
Basophils Absolute: 0
Eosinophils Absolute: 0.2
Lymphocytes Relative: 27
Monocytes Absolute: 0.8
Neutrophils Relative %: 60

## 2011-09-08 LAB — BASIC METABOLIC PANEL
BUN: 22
Calcium: 9.5
GFR calc non Af Amer: >60
Glucose, Bld: 294 — ABNORMAL HIGH
Potassium: 4.3

## 2011-09-08 LAB — CBC
Platelets: 130 — ABNORMAL LOW
RDW: 12.3
WBC: 7.8

## 2011-09-08 LAB — POCT CARDIAC MARKERS
CKMB, poc: 2.6
Myoglobin, poc: 228
Myoglobin, poc: 64.7
Operator id: 4533

## 2011-09-08 LAB — URINE MICROSCOPIC-ADD ON

## 2011-09-14 ENCOUNTER — Encounter (HOSPITAL_BASED_OUTPATIENT_CLINIC_OR_DEPARTMENT_OTHER): Payer: Medicare Other | Attending: Internal Medicine

## 2011-09-14 DIAGNOSIS — M1990 Unspecified osteoarthritis, unspecified site: Secondary | ICD-10-CM | POA: Insufficient documentation

## 2011-09-14 DIAGNOSIS — L97809 Non-pressure chronic ulcer of other part of unspecified lower leg with unspecified severity: Secondary | ICD-10-CM | POA: Insufficient documentation

## 2011-09-14 DIAGNOSIS — E039 Hypothyroidism, unspecified: Secondary | ICD-10-CM | POA: Insufficient documentation

## 2011-09-14 DIAGNOSIS — Z90710 Acquired absence of both cervix and uterus: Secondary | ICD-10-CM | POA: Insufficient documentation

## 2011-09-14 DIAGNOSIS — Z9089 Acquired absence of other organs: Secondary | ICD-10-CM | POA: Insufficient documentation

## 2011-09-14 DIAGNOSIS — Z79899 Other long term (current) drug therapy: Secondary | ICD-10-CM | POA: Insufficient documentation

## 2011-09-14 DIAGNOSIS — Z7982 Long term (current) use of aspirin: Secondary | ICD-10-CM | POA: Insufficient documentation

## 2011-09-14 DIAGNOSIS — M810 Age-related osteoporosis without current pathological fracture: Secondary | ICD-10-CM | POA: Insufficient documentation

## 2011-09-14 DIAGNOSIS — I10 Essential (primary) hypertension: Secondary | ICD-10-CM | POA: Insufficient documentation

## 2011-09-14 DIAGNOSIS — E119 Type 2 diabetes mellitus without complications: Secondary | ICD-10-CM | POA: Insufficient documentation

## 2011-09-14 DIAGNOSIS — Z85828 Personal history of other malignant neoplasm of skin: Secondary | ICD-10-CM | POA: Insufficient documentation

## 2011-09-14 DIAGNOSIS — R32 Unspecified urinary incontinence: Secondary | ICD-10-CM | POA: Insufficient documentation

## 2011-09-15 LAB — CBC
HCT: 34.1 % — ABNORMAL LOW (ref 36.0–46.0)
Hemoglobin: 11.9 g/dL — ABNORMAL LOW (ref 12.0–15.0)
MCHC: 34.9 g/dL (ref 30.0–36.0)
RBC: 3.58 MIL/uL — ABNORMAL LOW (ref 3.87–5.11)

## 2011-09-15 LAB — BASIC METABOLIC PANEL
CO2: 27 mEq/L (ref 19–32)
Calcium: 9.4 mg/dL (ref 8.4–10.5)
GFR calc Af Amer: >60 mL/min (ref >60)
Potassium: 4.4 mEq/L (ref 3.5–5.1)
Sodium: 135 mEq/L (ref 135–145)

## 2011-09-15 LAB — CK TOTAL AND CKMB (NOT AT ARMC)
CK, MB: 3.1 ng/mL (ref 0.3–4.0)
CK, MB: 7.5 ng/mL — ABNORMAL HIGH (ref 0.3–4.0)
Total CK: 34 U/L (ref 7–177)
Total CK: 83 U/L (ref 7–177)

## 2011-09-15 LAB — DIFFERENTIAL
Basophils Absolute: 0 10*3/uL (ref 0.0–0.1)
Lymphocytes Relative: 19 % (ref 12–46)
Lymphs Abs: 1.4 10*3/uL (ref 0.7–4.0)
Monocytes Absolute: 0.7 10*3/uL (ref 0.1–1.0)
Neutro Abs: 5.3 10*3/uL (ref 1.7–7.7)

## 2011-09-15 LAB — TROPONIN I: Troponin I: 0.11 ng/mL — ABNORMAL HIGH (ref 0.00–0.06)

## 2011-09-17 LAB — DIFFERENTIAL
Basophils Absolute: 0 10*3/uL (ref 0.0–0.1)
Basophils Absolute: 0.1 10*3/uL (ref 0.0–0.1)
Basophils Relative: 0 % (ref 0–1)
Eosinophils Relative: 1 % (ref 0–5)
Lymphocytes Relative: 19 % (ref 12–46)
Monocytes Absolute: 0.4 10*3/uL (ref 0.1–1.0)
Monocytes Absolute: 1.2 10*3/uL — ABNORMAL HIGH (ref 0.1–1.0)
Monocytes Relative: 3 % (ref 3–12)
Neutro Abs: 6 10*3/uL (ref 1.7–7.7)
Neutrophils Relative %: 62 % (ref 43–77)

## 2011-09-17 LAB — COMPREHENSIVE METABOLIC PANEL
AST: 18 U/L (ref 0–37)
Albumin: 3.3 g/dL — ABNORMAL LOW (ref 3.5–5.2)
Alkaline Phosphatase: 81 U/L (ref 39–117)
Chloride: 98 mEq/L (ref 96–112)
Creatinine, Ser: 0.8 mg/dL (ref 0.4–1.2)
GFR calc Af Amer: >60 mL/min (ref >60)
Potassium: 3.7 mEq/L (ref 3.5–5.1)
Total Bilirubin: 0.6 mg/dL (ref 0.3–1.2)

## 2011-09-17 LAB — CBC
MCHC: 33.8 g/dL (ref 30.0–36.0)
Platelets: 125 10*3/uL — ABNORMAL LOW (ref 150–400)
RDW: 13.3 % (ref 11.5–15.5)
WBC: 13.4 10*3/uL — ABNORMAL HIGH (ref 4.0–10.5)

## 2011-09-17 LAB — GLUCOSE, CAPILLARY
Glucose-Capillary: 104 mg/dL — ABNORMAL HIGH (ref 70–99)
Glucose-Capillary: 125 mg/dL — ABNORMAL HIGH (ref 70–99)
Glucose-Capillary: 140 mg/dL — ABNORMAL HIGH (ref 70–99)
Glucose-Capillary: 166 mg/dL — ABNORMAL HIGH (ref 70–99)
Glucose-Capillary: 229 mg/dL — ABNORMAL HIGH (ref 70–99)
Glucose-Capillary: 283 mg/dL — ABNORMAL HIGH (ref 70–99)
Glucose-Capillary: 316 mg/dL — ABNORMAL HIGH (ref 70–99)

## 2011-09-17 LAB — CK TOTAL AND CKMB (NOT AT ARMC): Total CK: 240 U/L — ABNORMAL HIGH (ref 7–177)

## 2011-09-17 LAB — URINALYSIS, ROUTINE W REFLEX MICROSCOPIC
Glucose, UA: >1000 mg/dL — AB
Hgb urine dipstick: NEGATIVE
Ketones, ur: NEGATIVE mg/dL
Protein, ur: NEGATIVE mg/dL

## 2011-09-17 LAB — CARDIAC PANEL(CRET KIN+CKTOT+MB+TROPI)
CK, MB: 1.2 ng/mL (ref 0.3–4.0)
CK, MB: 26 ng/mL — ABNORMAL HIGH (ref 0.3–4.0)
Relative Index: INVALID (ref 0.0–2.5)
Total CK: 171 U/L (ref 7–177)
Total CK: 207 U/L — ABNORMAL HIGH (ref 7–177)
Total CK: 26 U/L (ref 7–177)
Troponin I: 0.01 ng/mL (ref 0.00–0.06)
Troponin I: 5.45 ng/mL (ref 0.00–0.06)

## 2011-09-17 LAB — POCT CARDIAC MARKERS
CKMB, poc: 1.2 ng/mL (ref 1.0–8.0)
CKMB, poc: 1.7 ng/mL (ref 1.0–8.0)
CKMB, poc: 2.4 ng/mL (ref 1.0–8.0)
Myoglobin, poc: 69.1 ng/mL (ref 12–200)
Myoglobin, poc: 83.2 ng/mL (ref 12–200)
Troponin i, poc: <0.05 ng/mL (ref 0.00–0.09)

## 2011-09-17 LAB — TSH: TSH: 0.578 u[IU]/mL (ref 0.350–4.500)

## 2011-09-17 LAB — URINE MICROSCOPIC-ADD ON

## 2011-09-25 ENCOUNTER — Encounter: Payer: Self-pay | Admitting: Cardiology

## 2011-09-25 ENCOUNTER — Encounter: Payer: Self-pay | Admitting: *Deleted

## 2011-09-29 ENCOUNTER — Encounter: Payer: Self-pay | Admitting: Cardiology

## 2011-09-29 ENCOUNTER — Ambulatory Visit (INDEPENDENT_AMBULATORY_CARE_PROVIDER_SITE_OTHER): Payer: Medicare Other | Admitting: Cardiology

## 2011-09-29 VITALS — BP 118/56 | HR 58 | Ht 61.0 in | Wt 102.0 lb

## 2011-09-29 DIAGNOSIS — I252 Old myocardial infarction: Secondary | ICD-10-CM

## 2011-09-29 DIAGNOSIS — I2510 Atherosclerotic heart disease of native coronary artery without angina pectoris: Secondary | ICD-10-CM

## 2011-09-29 DIAGNOSIS — Z8679 Personal history of other diseases of the circulatory system: Secondary | ICD-10-CM

## 2011-09-29 DIAGNOSIS — I739 Peripheral vascular disease, unspecified: Secondary | ICD-10-CM

## 2011-09-29 DIAGNOSIS — R002 Palpitations: Secondary | ICD-10-CM

## 2011-09-29 DIAGNOSIS — I10 Essential (primary) hypertension: Secondary | ICD-10-CM

## 2011-09-29 NOTE — Progress Notes (Signed)
HPI [name] returns today for evaluation of her coronary disease. She's had no angina or ischemic symptoms. She has had one episode of palpitations in the middle of night that woke her up. They were short-lived. She was taking ibuprofen at the time and thought this might have been the cause of it. He stopped it and went back to Tylenol. She's had no recurrent palpitations.  She denies orthopnea, PND or edema. She's had no presyncope or syncope.  She is currently going to the wound center or a area that she scraped her right lower extremity. She and her daughter feel like it's getting better slowly and they're happy with her care.  She's compliant with her medications. Past Medical History  Diagnosis Date  . Coronary atherosclerosis of unspecified type of vessel, native or graft   . Other and unspecified hyperlipidemia   . Unspecified essential hypertension   . Peripheral vascular disease, unspecified   . Unspecified deficiency anemia   . Chronic pancreatitis   . Type II or unspecified type diabetes mellitus without mention of complication, not stated as uncontrolled   . Unspecified hypothyroidism   . Osteoporosis, unspecified   . Calculus of kidney   . Palpitations     history of    Past Surgical History  Procedure Date  . Total hip arthroplasty   . Appendectomy   . Abdominal hysterectomy     Family History  Problem Relation Age of Onset  . Coronary artery disease    . Diabetes      History   Social History  . Marital Status: Widowed    Spouse Name: N/A    Number of Children: N/A  . Years of Education: N/A   Occupational History  . retired    Social History Main Topics  . Smoking status: Never Smoker   . Smokeless tobacco: Never Used  . Alcohol Use: No  . Drug Use: No  . Sexually Active: Not on file   Other Topics Concern  . Not on file   Social History Narrative  . No narrative on file    Allergies  Allergen Reactions  . Penicillins    REACTION: Edema  . Sulfamethoxazole W/Trimethoprim     REACTION: Throat closing    Current Outpatient Prescriptions  Medication Sig Dispense Refill  . amLODipine (NORVASC) 10 MG tablet Take 5 mg by mouth daily.       Marland Kitchen aspirin 81 MG tablet Take 2-3 tablets per week by mouth      . atenolol (TENORMIN) 25 MG tablet take 1 and 1/2 tablets by mouth twice a day  90 tablet  3  . ENABLEX 7.5 MG 24 hr tablet Take 1 tablet by mouth Daily.      . furosemide (LASIX) 20 MG tablet Take 10 mg by mouth daily.        Marland Kitchen gemfibrozil (LOPID) 600 MG tablet Take 600 mg by mouth 2 (two) times daily before a meal.        . HYDROcodone-acetaminophen (VICODIN) 5-500 MG per tablet As directed      . insulin glargine (LANTUS) 100 UNIT/ML injection Inject 18 Units into the skin at bedtime.       . isosorbide mononitrate (IMDUR) 30 MG 24 hr tablet TAKE 1 TABLET BY MOUTH TWICE A DAY  60 tablet  10  . levothyroxine (SYNTHROID, LEVOTHROID) 112 MCG tablet Take 112 mcg by mouth daily.        . metFORMIN (GLUCOPHAGE) 500 MG tablet Take  500 mg by mouth 2 (two) times daily with a meal.        . nitroGLYCERIN (NITROSTAT) 0.4 MG SL tablet Place 0.4 mg under the tongue every 5 (five) minutes as needed.        . pantoprazole (PROTONIX) 40 MG tablet Take 40 mg by mouth 2 (two) times daily.        . potassium chloride (MICRO-K) 10 MEQ CR capsule Take two tablets weekly by mouth       . sitaGLIPtin (JANUVIA) 50 MG tablet Take 50 mg by mouth daily.          ROS Negative other than HPI.   PE General Appearance: well developed, well nourished in no acute distress, elderly HEENT: symmetrical face, PERRLA,  Neck: no JVD, thyromegaly, or adenopathy, trachea midline Chest: symmetric without deformity Cardiac: PMI non-displaced, RRR, normal S1, S2, no gallop or murmur Lung: clear to ausculation and percussion Vascular: Diminished peripheral pulses in the lower extremities. Soft right carotid bruit Abdominal: nondistended, nontender,  good bowel sounds, no HSM, no bruits Extremities: no cyanosis, clubbing or edema, no sign of DVT, Venous varicosities, bandaged area over the right mid tibia Skin: normal color, no rashes Neuro: alert and oriented x 3, non-focal Pysch: normal affect Filed Vitals:   09/29/11 1128  BP: 118/56  Pulse: 58  Height: 5\' 1"  (1.549 m)  Weight: 102 lb (46.267 kg)    EKG Sinus bradycardia with PACs. No acute changes Labs and Studies Reviewed.   Lab Results  Component Value Date   WBC 8.8 WHITE COUNT CONFIRMED ON SMEAR 11/25/2010   HGB 11.9* 11/25/2010   HCT 35.9* 11/25/2010   MCV 96.8 11/25/2010   PLT 119 PLATELET COUNT CONFIRMED BY SMEAR LARGE PLATELETS PRESENT* 11/25/2010      Chemistry      Component Value Date/Time   NA 129* 11/25/2010 1113   K 3.8 11/25/2010 1113   CL 95* 11/25/2010 1113   CO2 24 11/25/2010 1113   BUN 13 11/25/2010 1113   CREATININE 0.74 11/25/2010 1113      Component Value Date/Time   CALCIUM 9.2 11/25/2010 1113   ALKPHOS 80 11/25/2010 1113   AST 22 11/25/2010 1113   ALT 13 11/25/2010 1113   BILITOT 0.5 11/25/2010 1113       No results found for this basename: CHOL   No results found for this basename: HDL   No results found for this basename: LDLCALC   No results found for this basename: TRIG   No results found for this basename: CHOLHDL   Lab Results  Component Value Date   HGBA1C  Value: 7.8 (NOTE) The ADA recommends the following therapeutic goal for glycemic control related to Hgb A1c measurement: Goal of therapy: <6.5 Hgb A1c  Reference: American Diabetes Association: Clinical Practice Recommendations 2010, Diabetes Care, 2010, 33: (Suppl  1).* 05/13/2009   Lab Results  Component Value Date   ALT 13 11/25/2010   AST 22 11/25/2010   ALKPHOS 80 11/25/2010   BILITOT 0.5 11/25/2010

## 2011-09-29 NOTE — Assessment & Plan Note (Signed)
Good control no change in medication.

## 2011-09-29 NOTE — Assessment & Plan Note (Signed)
Stable. Continue conservative therapy with medication.

## 2011-09-29 NOTE — Assessment & Plan Note (Signed)
One brief episode. Doubt related to anti-inflammatory. Advised or let me know if these continue to happen

## 2011-09-29 NOTE — Patient Instructions (Signed)
Your physician wants you to follow-up in: 1 year with Dr. Wall. You will receive a reminder letter in the mail two months in advance. If you don't receive a letter, please call our office to schedule the follow-up appointment.  

## 2011-09-29 NOTE — Assessment & Plan Note (Signed)
Continue wound center care.

## 2011-10-12 ENCOUNTER — Other Ambulatory Visit: Payer: Self-pay | Admitting: Cardiology

## 2011-10-19 ENCOUNTER — Other Ambulatory Visit: Payer: Self-pay

## 2011-10-19 ENCOUNTER — Encounter (HOSPITAL_BASED_OUTPATIENT_CLINIC_OR_DEPARTMENT_OTHER): Payer: Medicare Other | Attending: Internal Medicine

## 2011-10-19 DIAGNOSIS — S81009A Unspecified open wound, unspecified knee, initial encounter: Secondary | ICD-10-CM | POA: Insufficient documentation

## 2011-10-19 DIAGNOSIS — Z79899 Other long term (current) drug therapy: Secondary | ICD-10-CM | POA: Insufficient documentation

## 2011-10-19 DIAGNOSIS — I10 Essential (primary) hypertension: Secondary | ICD-10-CM | POA: Insufficient documentation

## 2011-10-19 DIAGNOSIS — L97809 Non-pressure chronic ulcer of other part of unspecified lower leg with unspecified severity: Secondary | ICD-10-CM | POA: Insufficient documentation

## 2011-10-19 DIAGNOSIS — I252 Old myocardial infarction: Secondary | ICD-10-CM | POA: Insufficient documentation

## 2011-10-19 DIAGNOSIS — E039 Hypothyroidism, unspecified: Secondary | ICD-10-CM | POA: Insufficient documentation

## 2011-10-19 DIAGNOSIS — X58XXXA Exposure to other specified factors, initial encounter: Secondary | ICD-10-CM | POA: Insufficient documentation

## 2011-10-19 DIAGNOSIS — I739 Peripheral vascular disease, unspecified: Secondary | ICD-10-CM | POA: Insufficient documentation

## 2011-10-19 DIAGNOSIS — C44721 Squamous cell carcinoma of skin of unspecified lower limb, including hip: Secondary | ICD-10-CM | POA: Insufficient documentation

## 2011-10-19 DIAGNOSIS — M810 Age-related osteoporosis without current pathological fracture: Secondary | ICD-10-CM | POA: Insufficient documentation

## 2011-10-19 DIAGNOSIS — Z7982 Long term (current) use of aspirin: Secondary | ICD-10-CM | POA: Insufficient documentation

## 2011-11-16 ENCOUNTER — Encounter (HOSPITAL_BASED_OUTPATIENT_CLINIC_OR_DEPARTMENT_OTHER): Payer: Medicare Other

## 2011-11-17 ENCOUNTER — Other Ambulatory Visit: Payer: Self-pay | Admitting: Cardiovascular Disease

## 2011-12-17 ENCOUNTER — Other Ambulatory Visit: Payer: Self-pay | Admitting: Cardiovascular Disease

## 2012-01-06 ENCOUNTER — Encounter (HOSPITAL_BASED_OUTPATIENT_CLINIC_OR_DEPARTMENT_OTHER): Payer: Medicare Other | Attending: Plastic Surgery

## 2012-01-06 DIAGNOSIS — L97809 Non-pressure chronic ulcer of other part of unspecified lower leg with unspecified severity: Secondary | ICD-10-CM | POA: Insufficient documentation

## 2012-01-06 DIAGNOSIS — I8390 Asymptomatic varicose veins of unspecified lower extremity: Secondary | ICD-10-CM | POA: Insufficient documentation

## 2012-01-07 NOTE — Progress Notes (Signed)
[facility]  NAME:  [name], [name] NO.:  [id number]  MEDICAL RECORD NO.:  [id number]      DATE OF BIRTH:  07-May-1913  PHYSICIAN:  Wayland Denis, DO       VISIT DATE:  01/06/2012                                  OFFICE VISIT   HISTORY:  [name] is a 78 year old white female, who is here with family and a caregiver for re-evaluation of her right leg ulcer.  She has had this ulcer for several months and was undergoing local care when biopsy was done showing a squamous cell carcinoma.  She had it excised and closed.  Unfortunately, it opened up and now, it has a ulceration. She has been using some saline dressing changes on it at present.  Her venous insufficiency and diabetes is of course contributing to her slow healing.  The biopsy date was October 19, 2011.  Over the past week, there has not been much change and presents for further care.  PAST MEDICAL HISTORY: 1. Hypertension. 2. MI. 3. Diabetes. 4. Sinus disease. 5. Skin cancer. 6. Hepatitis. 7. Severe anxiety. 8. Hearing difficulties.  CURRENT MEDICATIONS:  Include Synthroid, Lasix, aspirin, amlodipine, isosorbide, Klor-Con, vitamin D, Glucophage, pantoprazole, Foltrate, Enablex, Januvia, gemfibrozil, calcium, and atenolol.  SOCIAL HISTORY:  She denies tobacco use or alcohol.  Has family support and help at home.  REVIEW OF SYSTEMS:  Negative.  PHYSICAL EXAMINATION:  GENERAL:  She is alert, oriented, cooperative, not in any acute distress.  She is very pleasant, in some distress, but nothing acute. HEENT:  Her pupils are equal.  Her extraocular muscles are intact. NECK:  She does not have any cervical lymphadenopathy. LUNGS:  Breathing is unlabored. HEART:  Regular. ABDOMEN:  Soft.  The wound is noted in the nurse's notes.  It has some fibrous tissue that was debrided and note indicates to the depth.  It does not appear to be infected.  There is some good  granulation tissue underneath.  This should be able to heal with some local care.  I would like to do Santyl dressing changes with a Profore Lite, some Hydrogel, and we will apply for possible collagen and maybe even Apligraf.  In the meantime, recommend strict blood pressure and blood sugar control, healthy eating, multivitamin, vitamin C, and zinc.  We will see her back in a week.  I also gave her a prescription for Vicodin.  If she needs any further pain medicines, then we will discuss with the Pain [facility].     Wayland Denis, DO     CS/MEDQ  D:  01/06/2012  T:  01/07/2012  Job:  [id number]

## 2012-01-13 NOTE — Progress Notes (Signed)
[facility]  NAME:  [name], SHEAR NO.:  MEDICAL RECORD NO.:  [id number]      DATE OF BIRTH:  05-01-1913  PHYSICIAN:  Wayland Denis, DO       VISIT DATE:  01/13/2012                                  OFFICE VISIT   HISTORY:  [name] is a 78 year old female, who is here for followup on her right lower extremity ulceration.  She has been using Santyl with Profore Lite.  There is good improvement, debrided more today but overall is looking much better than it did last week.  There has been no change in her medications or social history.  PHYSICAL EXAM:  GENERAL: She is alert, oriented.  She is very pleasant but in a little bit of pain if it is manipulated at all. HEENT: Pupils are equal.  Extraocular muscles are intact. NECK: No cervical lymphadenopathy. LUNGS: Breathing is unlabored. HEART: Regular. ABDOMEN: Soft. EXTREMITIES:  She has good pulses.  She has got some varicose veins in the right lower extremity, but no cellulitis or stranding.  The wound size is noted in the nurse's notes, and debridement was done.  PLAN:  I would recommend continuing with Santyl Hydrogel with Profore Lite.  Follow up in 1 week.  She may also need to go to the operating room for formal debridement as it is extremely painful, and she cannot tolerate a lot more than from local debridement.  We will see how the Santyl works over the next week.     Wayland Denis, DO     CS/MEDQ  D:  01/13/2012  T:  01/13/2012  Job:  [id number]

## 2012-01-20 ENCOUNTER — Encounter (HOSPITAL_BASED_OUTPATIENT_CLINIC_OR_DEPARTMENT_OTHER): Payer: Medicare Other | Attending: Plastic Surgery

## 2012-01-20 DIAGNOSIS — L97909 Non-pressure chronic ulcer of unspecified part of unspecified lower leg with unspecified severity: Secondary | ICD-10-CM | POA: Insufficient documentation

## 2012-01-21 NOTE — Progress Notes (Signed)
[facility]  NAME:  TARON, CONREY NO.:  [id number]  MEDICAL RECORD NO.:  [id number]      DATE OF BIRTH:  Apr 04, 1913  PHYSICIAN:  Wayland Denis, DO            VISIT DATE:                                  OFFICE VISIT   Ms. Chaudhary is a 78 year old female here with her family for followup on her right lower extremity ulcer from excision of skin cancer.  She has been using Santyl over the past week, still very tender, but she does have improvement in the overall appearance and granulation.  There has been no change in her medications.  PHYSICAL EXAMINATION:  GENERAL:  She is alert, oriented, cooperative, not in any acute distress.  She is pleasant. HEENT:  Pupils are equal.  Extraocular muscles are intact. NECK:  No cervical lymphadenopathy. CHEST:  Her breathing is unlabored. HEART:  Regular. ABDOMEN:  Soft.  She does have varicose veins of this lower extremity and abnormal vascular study reported by her daughter, which we will try and get our hands on.  The pulse is present, but weak.  She has dilated veins.  The peri wound area is a little bit red, but improved from last week. Debridement was done and noted in the nurse's note.  We will continue with Santyl wrap and have her follow up in a week.  We will apply for the back and the legs.     Wayland Denis, DO     CS/MEDQ  D:  01/20/2012  T:  01/21/2012  Job:  [id number]

## 2012-01-28 NOTE — Progress Notes (Signed)
[facility]  NAME:  CONSUELA, WIDENER NO.:  MEDICAL RECORD NO.:  [id number]      DATE OF BIRTH:  [birth year]  PHYSICIAN:  Wayland Denis, DO       VISIT DATE:  01/27/2012                                  OFFICE VISIT   Ms. Renn is a 78 year old female here with her family for followup on her right posterior leg ulcer.  She had excision of skin cancer, which is resolved; however, she is left with ulceration.  She has been using Santyl and it does show some improvement.  We were able to debride the skin and soft tissue today with more viable granulation tissue at the base.  There has been no change in her medications or review of systems.  On exam, she is alert and cooperative.  The area is painful, but she tolerated the debridement.  Her pupils are equal.  Glasses are in place. Extraocular muscles are intact.  No cervical lymphadenopathy.  Her breathing is unlabored.  Her heart rate is regular.  Her abdomen is soft.  The wound is improving.  Hopefully, we will be able to use an Oasis next week and to see if we can get this healed in a more timely fashion.  So, we will continue with the Santyl in the wrap.     Wayland Denis, DO     CS/MEDQ  D:  01/27/2012  T:  01/28/2012  Job:  [id number]

## 2012-02-04 NOTE — Progress Notes (Signed)
[facility]  NAME:  [name], [name] NO.:  MEDICAL RECORD NO.:  [id number]      DATE OF BIRTH:  [birth year]  PHYSICIAN:  Wayland Denis, DO       VISIT DATE:  02/03/2012                                  OFFICE VISIT   [name] is a 78 year old female here with her daughter and her caregiver for evaluation of her right lower extremity ulcer.  She has been using Santyl over the past week with very good improvement, more granulation tissue and looks like better blood supply as well.  There has been no change in her medications or social history.  She was given a pain medicine prior to coming, which may have made her a little bit dizzy, but she did recover with a protein drink.  On exam, she is alert, oriented, cooperative, very pleasant.  Her pupils are equal.  Extraocular muscles are intact.  Her breathing is unlabored. Her heart rate is regular.  Her abdomen is soft, nontender.  The wound was debrided and nurse's notes are noted in the chart.  Oasis was applied and activated with normal saline.  Adaptic was placed over it.  She tolerated the procedure very well.  I will continue with that for the week and see her back.  She is to keep it elevated and continue with a multivitamin.     Wayland Denis, DO     CS/MEDQ  D:  02/03/2012  T:  02/03/2012  Job:  [id number]

## 2012-02-10 ENCOUNTER — Encounter (HOSPITAL_BASED_OUTPATIENT_CLINIC_OR_DEPARTMENT_OTHER): Payer: Medicare Other

## 2012-02-11 ENCOUNTER — Other Ambulatory Visit: Payer: Self-pay | Admitting: Cardiology

## 2012-02-11 NOTE — Progress Notes (Signed)
[facility]  NAME:  HAFSA, LOHN NO.:  MEDICAL RECORD NO.:  [id number]      DATE OF BIRTH:  [birth year]  PHYSICIAN:  Wayland Denis, DO            VISIT DATE:                                  OFFICE VISIT   [name] is a 78 year old female here with her family and her caregiver for followup on her right lower extremity ulcer.  It looks a little bit better.  The peri wound area is less irritated and red.  The Oasis appears to have taken well.  There has been no change in her social history.  Her medications, she has altered some with her tramadol that seems to be doing much better for her today.  PHYSICAL EXAMINATION:  GENERAL:  She is alert, oriented, cooperative, not in any acute distress.  She is pleasant. HEENT:  Pupils are equal.  Extraocular muscles are intact. NECK:  No cervical lymphadenopathy. LUNGS:  Her breathing is unlabored. HEART:  Regular. ABDOMEN:  Soft.  Her wound is as described above and more Oasis was placed.  It was prepared according to the manufacture's guidelines, activated with saline and Steri-Strips, Adaptic, and a dressing in place, Profore Lite and we will have her follow up in 1 week.     Wayland Denis, DO     CS/MEDQ  D:  02/10/2012  T:  02/11/2012  Job:  [id number]

## 2012-02-17 ENCOUNTER — Encounter (HOSPITAL_BASED_OUTPATIENT_CLINIC_OR_DEPARTMENT_OTHER): Payer: Medicare Other | Attending: Plastic Surgery

## 2012-02-17 DIAGNOSIS — Z85828 Personal history of other malignant neoplasm of skin: Secondary | ICD-10-CM | POA: Insufficient documentation

## 2012-02-17 DIAGNOSIS — L97809 Non-pressure chronic ulcer of other part of unspecified lower leg with unspecified severity: Secondary | ICD-10-CM | POA: Insufficient documentation

## 2012-02-25 NOTE — Progress Notes (Signed)
[facility]  NAME:  [name], ALLOR NO.:  [id number]  MEDICAL RECORD NO.:  [id number]      DATE OF BIRTH:  03/27/1913  PHYSICIAN:  Wayland Denis, DO       VISIT DATE:  02/24/2012                                  OFFICE VISIT   The patient is a 78 year old female here with her family and caregiver for follow up on her right lower extremity ulcer.  She had Oasis placed 2 weeks ago.  There was still some in place.  Overall, the periwound area is markedly improved, less red, less irritated, and swollen.  The wound seems to be doing well with the Oasis and I would not debride it today.  No change in her social history or medications.  Review of systems is negative.  On exam, she is alert, oriented, cooperative, not in any acute distress. She is very pleasant.  Pupils are equal.  Extraocular muscles are intact.  No cervical lymphadenopathy.  Her breathing is unlabored.  Her heart is regular.  Her abdomen is soft.  The wound is noted above in the nurse's note.  We will continue with a Profore Lite, but we will add collagen and Hydrogel, and hopefully be able to add Oasis next week.     Alan Ripper Sanger, DO     CS/MEDQ  D:  02/24/2012  T:  02/25/2012  Job:  [id number]

## 2012-03-03 NOTE — Progress Notes (Signed)
[facility]  NAME:  IMYA, MANCE NO.:  [id number]  MEDICAL RECORD NO.:  [id number]      DATE OF BIRTH:  05-28-1913  PHYSICIAN:  Wayland Denis, DO            VISIT DATE:                                  OFFICE VISIT   The patient is a 78 year old female that had a malignancy of her right lower extremity that was excised.  She was treated with Oasis and has responded extremely well with a decrease in depth and overall size.  At this point, we will need a smaller one because of her healing, so we will apply for that.  There has been no change in her medications.  REVIEW OF SYSTEMS:  Negative.  PHYSICAL EXAMINATION:  GENERAL:  She is alert, oriented, cooperative, not in any acute distress.  She is very pleasant as usual. HEENT:  Pupils are equal.  Extraocular muscles are intact. NECK:  No cervical lymphadenopathy. LUNGS:  Her breathing is unlabored. HEART:  Rate is regular.  Wound is improving, granulating.  No sign of infection.  We will add collagen and continue with the Profore Lite and see her back in a week and hopefully we will be able to add the Oasis.     Wayland Denis, DO     CS/MEDQ  D:  03/02/2012  T:  03/03/2012  Job:  [id number]

## 2012-03-10 NOTE — Progress Notes (Signed)
[facility]  NAME:  JORDIS, REPETTO NO.:  MEDICAL RECORD NO.:  [id number]      DATE OF BIRTH:  08/14/1913  PHYSICIAN:  Wayland Denis, DO       VISIT DATE:  03/09/2012                                  OFFICE VISIT   The patient is a 78 year old female here with friends and family for followup on her right lower extremity ulcer after excision of a malignancy.  She had been using collagen and a Profore Lite over the last couple weeks after she had had an Oasis placed.  It does look better.  It is getting smaller and granulating and epithelializing at the side.  Overall, she is doing much better.  There has been no change in her medications or social history.  On exam, she is alert, oriented, cooperative, not in any acute distress. She is pleasant.  Pupils are equal.  Extraocular muscles are intact.  No cervical lymphadenopathy.  Breathing is unlabored.  Heart is regular. Abdomen is soft.  The wound is as noted above and in the notes.  We will go ahead and put an Oasis on.  A time-out was called.  All information was confirmed to be correct.  The Oasis was prepared according to the manufacture guidelines and dressed accordingly as well.  We will have her follow up in 1 week.     Wayland Denis, DO     CS/MEDQ  D:  03/09/2012  T:  03/09/2012  Job:  [id number]

## 2012-03-16 ENCOUNTER — Encounter (HOSPITAL_BASED_OUTPATIENT_CLINIC_OR_DEPARTMENT_OTHER): Payer: Medicare Other

## 2012-03-16 ENCOUNTER — Encounter (HOSPITAL_BASED_OUTPATIENT_CLINIC_OR_DEPARTMENT_OTHER): Payer: Medicare Other | Attending: Plastic Surgery

## 2012-03-16 DIAGNOSIS — L97809 Non-pressure chronic ulcer of other part of unspecified lower leg with unspecified severity: Secondary | ICD-10-CM | POA: Insufficient documentation

## 2012-03-16 DIAGNOSIS — C44791 Other specified malignant neoplasm of skin of unspecified lower limb, including hip: Secondary | ICD-10-CM | POA: Insufficient documentation

## 2012-03-16 DIAGNOSIS — T8189XA Other complications of procedures, not elsewhere classified, initial encounter: Secondary | ICD-10-CM | POA: Insufficient documentation

## 2012-03-16 DIAGNOSIS — Z85828 Personal history of other malignant neoplasm of skin: Secondary | ICD-10-CM | POA: Insufficient documentation

## 2012-03-16 DIAGNOSIS — Y838 Other surgical procedures as the cause of abnormal reaction of the patient, or of later complication, without mention of misadventure at the time of the procedure: Secondary | ICD-10-CM | POA: Insufficient documentation

## 2012-03-17 NOTE — Progress Notes (Signed)
[facility]  NAME:  [name], BAZALDUA NO.:  [id number]  MEDICAL RECORD NO.:  [id number]      DATE OF BIRTH:  28-Nov-1913  PHYSICIAN:  Wayland Denis, DO            VISIT DATE:                                  OFFICE VISIT   The patient is a 78 year old female here with family for followup on her right lower extremity wound secondary to excision of a malignancy.  We put her on Oasis on last week, which has shown some improvement.  She has quite a bit of exudate today with some peri wound redness, so we will clean that up.  There has been no change in her medications.  REVIEW OF SYSTEMS:  Negative.  PHYSICAL EXAMINATION:  GENERAL:  She is alert, oriented, cooperative, not in any acute distress.  She is pleasant. HEENT:  Pupils are equal.  Extraocular muscles are intact. NECK:  No cervical lymphadenopathy.  The wound is improving with a decrease in the size and epithelialization of the wound bed and we are going to place collagen on it today and hopefully be able to place Oasis next week and her family agreed with the plan.     Wayland Denis, DO     CS/MEDQ  D:  03/16/2012  T:  03/17/2012  Job:  [id number]

## 2012-03-31 NOTE — Progress Notes (Signed)
[facility]  NAME:  [name], [name] NO.:  MEDICAL RECORD NO.:  [id number]      DATE OF BIRTH:  July 26, 1913  PHYSICIAN:  Wayland Denis, DO       VISIT DATE:  03/30/2012                                  OFFICE VISIT   The patient is a 78 year old female who had a malignancy on her right leg that was excised.  She is having a difficult time healing the area. We used Oasis in the past with a Profore Lite, and she has had a good response with granulation and epithelialization.  There is no change in her medications or social history.  On exam, she is alert, oriented, cooperative, not in any acute distress. She is pleasant.  Pupils equal extraocular muscles are intact.  No cervical lymphadenopathy.  Breathing is unlabored.  Heart is regular.  The wound is improving especially in the peri-wound area.  It is epithelializing and granulating.  We will continue with Oasis that was placed today and those notes are noted.  Sterile technique was used.  A time-out was called.  All information was confirmed to be correct.  It was dressed in the usual fashion.  We will have her follow back up with [place] in a week.     Wayland Denis, DO     CS/MEDQ  D:  03/30/2012  T:  03/30/2012  Job:  [id number]

## 2012-04-20 ENCOUNTER — Encounter (HOSPITAL_BASED_OUTPATIENT_CLINIC_OR_DEPARTMENT_OTHER): Payer: Medicare Other | Attending: Plastic Surgery

## 2012-04-20 DIAGNOSIS — L97809 Non-pressure chronic ulcer of other part of unspecified lower leg with unspecified severity: Secondary | ICD-10-CM | POA: Insufficient documentation

## 2012-04-20 NOTE — Progress Notes (Signed)
[facility]  NAME:  [name], [name] NO.:  [id number]  MEDICAL RECORD NO.:  [id number]      DATE OF BIRTH:  March 09, 1913  PHYSICIAN:  Wayland Denis, DO       VISIT DATE:  04/20/2012                                  OFFICE VISIT   The patient is a 78 year old female, who is here for followup on her right lower extremity ulcer.  She fell and hurt her knee, so she was home for a couple of weeks and the wraps were not as distal as they needed to be, but because she was off her leg, the wound is actually markedly improved.  She has got some swelling of the foot area but overall is doing better.  There has been no change in her medications or social history and her knee is healing.  EXAM:  GENERAL: She is alert, oriented, very pleasant. EYES: Pupils are equal.  Extraocular muscles are intact. NECK: No cervical lymphadenopathy. RESPIRATORY: Breathing is unlabored. CARDIOVASCULAR: Her heart is regular.  Oasis was placed according to the manufacture guidelines in sterile technique.  A time-out was called.  All information was confirmed to be correct.  All of the Oasis was used.  She was dressed and wrapped, and we will see her back in a week.     Wayland Denis, DO     CS/MEDQ  D:  04/20/2012  T:  04/20/2012  Job:  [id number]

## 2012-04-28 NOTE — Progress Notes (Signed)
[facility]  NAME:  [name], WOOLMAN NO.:  MEDICAL RECORD NO.:  [id number]      DATE OF BIRTH:  27-Nov-1913  PHYSICIAN:  Wayland Denis, DO       VISIT DATE:  04/27/2012                                  OFFICE VISIT   The patient is a 78 year old female here with her caregiver and her daughter.  She is doing extremely well.  She has been drinking 1 Juven at least once a day.  She has been doing dressing changes and had Oasis placed previously.  The wound is pink and granulating. The periwound area looks much better.  Her leg in general looks much better.  There is no sign of infection.  The medications are unchanged.  Social history unchanged.  On exam, she is alert, oriented, very cooperative.  The Oasis was prepared according to the manufacture's guidelines and placed on the site folded over with a saline spray on it.  The whole thing was used and then dressed according to the guidelines.  We will have her follow up in 1 week.     Wayland Denis, DO     CS/MEDQ  D:  04/27/2012  T:  04/27/2012  Job:  [id number]

## 2012-05-05 ENCOUNTER — Emergency Department (HOSPITAL_COMMUNITY)
Admission: EM | Admit: 2012-05-05 | Discharge: 2012-05-05 | Disposition: A | Discharge status: Home / Self Care | Payer: Medicare Other | Attending: Emergency Medicine | Admitting: Emergency Medicine

## 2012-05-05 ENCOUNTER — Encounter (HOSPITAL_COMMUNITY): Payer: Self-pay | Admitting: Emergency Medicine

## 2012-05-05 DIAGNOSIS — R197 Diarrhea, unspecified: Secondary | ICD-10-CM

## 2012-05-05 DIAGNOSIS — Z79899 Other long term (current) drug therapy: Secondary | ICD-10-CM | POA: Insufficient documentation

## 2012-05-05 DIAGNOSIS — I2510 Atherosclerotic heart disease of native coronary artery without angina pectoris: Secondary | ICD-10-CM | POA: Insufficient documentation

## 2012-05-05 DIAGNOSIS — R011 Cardiac murmur, unspecified: Secondary | ICD-10-CM | POA: Insufficient documentation

## 2012-05-05 DIAGNOSIS — E785 Hyperlipidemia, unspecified: Secondary | ICD-10-CM | POA: Insufficient documentation

## 2012-05-05 DIAGNOSIS — M810 Age-related osteoporosis without current pathological fracture: Secondary | ICD-10-CM | POA: Insufficient documentation

## 2012-05-05 DIAGNOSIS — Z794 Long term (current) use of insulin: Secondary | ICD-10-CM | POA: Insufficient documentation

## 2012-05-05 DIAGNOSIS — I10 Essential (primary) hypertension: Secondary | ICD-10-CM | POA: Insufficient documentation

## 2012-05-05 DIAGNOSIS — E119 Type 2 diabetes mellitus without complications: Secondary | ICD-10-CM | POA: Insufficient documentation

## 2012-05-05 DIAGNOSIS — Z7982 Long term (current) use of aspirin: Secondary | ICD-10-CM | POA: Insufficient documentation

## 2012-05-05 DIAGNOSIS — I739 Peripheral vascular disease, unspecified: Secondary | ICD-10-CM | POA: Insufficient documentation

## 2012-05-05 HISTORY — DX: Carcinoma in situ of skin of right lower limb, including hip: D04.71

## 2012-05-05 LAB — COMPREHENSIVE METABOLIC PANEL
AST: 25 U/L (ref 0–37)
Albumin: 3 g/dL — ABNORMAL LOW (ref 3.5–5.2)
Alkaline Phosphatase: 78 U/L (ref 39–117)
Chloride: 101 mEq/L (ref 96–112)
Creatinine, Ser: 0.57 mg/dL (ref 0.50–1.10)
Potassium: 3.7 mEq/L (ref 3.5–5.1)
Total Bilirubin: 0.2 mg/dL — ABNORMAL LOW (ref 0.3–1.2)
Total Protein: 7 g/dL (ref 6.0–8.3)

## 2012-05-05 LAB — DIFFERENTIAL
Basophils Absolute: 0.1 10*3/uL (ref 0.0–0.1)
Eosinophils Absolute: 0.1 10*3/uL (ref 0.0–0.7)
Lymphocytes Relative: 32 % (ref 12–46)
Monocytes Relative: 10 % (ref 3–12)

## 2012-05-05 LAB — URINALYSIS, ROUTINE W REFLEX MICROSCOPIC
Glucose, UA: NEGATIVE mg/dL
Hgb urine dipstick: NEGATIVE
Leukocytes, UA: NEGATIVE
Protein, ur: NEGATIVE mg/dL
pH: 6.5 (ref 5.0–8.0)

## 2012-05-05 LAB — CBC
MCHC: 32.9 g/dL (ref 30.0–36.0)
RDW: 14.2 % (ref 11.5–15.5)
WBC: 6.6 10*3/uL (ref 4.0–10.5)

## 2012-05-05 MED ORDER — SODIUM CHLORIDE 0.9 % IV BOLUS (SEPSIS)
1000.0000 mL | Freq: Once | INTRAVENOUS | Status: AC
  Administered 2012-05-05: 1000 mL via INTRAVENOUS

## 2012-05-05 NOTE — ED Provider Notes (Signed)
History     CSN: [id number]  Arrival date & time 05/05/12  1531   First MD Initiated Contact with Patient 05/05/12 1649      Chief Complaint  Patient presents with  . Diarrhea     HPI The patient presents with concerns of diarrhea.  She notes that her symptoms began approximately 6 days ago, about the time she was started on Macrobid for a UTI.  After she had several days of diarrhea, her primary care physician switched her from Macrobid to Flagyl, which she continues to take.  She notes that she has improved slightly in the past few days, but after having more loose stool, though no watery diarrhea, today she presents for evaluation.  She denies any concurrent abdominal pain, fevers, chills, dyspnea, chest pain   Past Medical History  Diagnosis Date  . Coronary atherosclerosis of unspecified type of vessel, native or graft   . Other and unspecified hyperlipidemia   . Unspecified essential hypertension   . Peripheral vascular disease, unspecified   . Unspecified deficiency anemia   . Chronic pancreatitis   . Type II or unspecified type diabetes mellitus without mention of complication, not stated as uncontrolled   . Unspecified hypothyroidism   . Osteoporosis, unspecified   . Calculus of kidney   . Palpitations     history of  . 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 Total hip arthroplasty   . Appendectomy   . Abdominal hysterectomy     Family History  Problem Relation Age of Onset  . Coronary artery disease    . Diabetes      History  Substance Use Topics  . Smoking status: Never Smoker   . Smokeless tobacco: Never Used  . Alcohol Use: No    OB History    Grav Para Term Preterm Abortions TAB SAB Ect Mult Living                  Review of Systems  Constitutional:       HPI  HENT:       HPI otherwise negative  Eyes: Negative.   Respiratory:       HPI, otherwise negative    Cardiovascular:       HPI, otherwise nmegative  Gastrointestinal: Negative for vomiting.  Genitourinary:       HPI, otherwise negative  Musculoskeletal:       HPI, otherwise negative  Skin: Negative.   Neurological: Negative for syncope.    Allergies  Ciprofloxacin; Codeine; Penicillins; and Sulfamethoxazole w-trimethoprim  Home Medications   Current Outpatient Rx  Name Route Sig Dispense Refill  . AMLODIPINE BESYLATE 10 MG PO TABS Oral Take 5 mg by mouth daily.    . ASPIRIN 81 MG PO TABS Oral Take 81 mg by mouth daily as needed. For pain.    . ATENOLOL 25 MG PO TABS  take 1 and 1/2 tablets by mouth twice a day 90 tablet 3  . FUROSEMIDE 20 MG PO TABS Oral Take 10 mg by mouth daily.      Marland Kitchen GEMFIBROZIL 600 MG PO TABS Oral Take 600 mg by mouth 2 (two) times daily before a meal.      . INSULIN GLARGINE 100 UNIT/ML [place] SOLN Subcutaneous Inject 16 Units into the skin daily.     . ISOSORBIDE MONONITRATE ER 30 MG PO TB24  TAKE 1 TABLET BY MOUTH TWICE  A DAY 60 tablet 10  . LEVOTHYROXINE SODIUM 112 MCG PO TABS Oral Take 112 mcg by mouth daily.      Marland Kitchen METFORMIN HCL 500 MG PO TABS Oral Take 500 mg by mouth 2 (two) times daily with a meal.      . NITROGLYCERIN 0.4 MG SL SUBL  place 1 tablet under the tongue if needed for chest pain 25 tablet 3  . PANTOPRAZOLE SODIUM 40 MG PO TBEC Oral Take 40 mg by mouth 2 (two) times daily.      Marland Kitchen POTASSIUM CHLORIDE ER 10 MEQ PO CPCR Oral Take 10 mEq by mouth 2 (two) times a week.     Marland Kitchen PREGABALIN 50 MG PO CAPS Oral Take 50 mg by mouth 2 (two) times daily.    Marland Kitchen SITAGLIPTIN PHOSPHATE 50 MG PO TABS Oral Take 50 mg by mouth daily.        BP 135/41  Pulse 65  Temp(Src) 98.5 F (36.9 C) (Oral)  Resp 18  Ht 5\' 1"  (1.549 m)  Wt 101 lb (45.813 kg)  BMI 19.08 kg/m2  SpO2 99%  Physical Exam  Nursing note and vitals reviewed. Constitutional: She is oriented to person, place, and time. She appears well-developed and well-nourished. No distress.  HENT:  Head:  Normocephalic and atraumatic.  Eyes: Conjunctivae and EOM are normal.  Cardiovascular: Normal rate and regular rhythm.   Murmur heard. Pulmonary/Chest: Effort normal and breath sounds normal. No stridor. No respiratory distress.  Abdominal: She exhibits no distension.  Musculoskeletal: She exhibits no edema.  Neurological: She is alert and oriented to person, place, and time. No cranial nerve deficit.  Skin: Skin is warm and dry.  Psychiatric: She has a normal mood and affect.    ED Course  Procedures (including critical care time)   Labs Reviewed  CBC  DIFFERENTIAL  COMPREHENSIVE METABOLIC PANEL  URINALYSIS, ROUTINE W REFLEX MICROSCOPIC   No results found.   No diagnosis found.   6:46 PM The patient notes that she is feeling well and would like to go home MDM  This elderly female presents with several days of loose stool.  Notably, the patient initiated antibiotic therapy The onset of her diarrhea, and she states that her diarrhea has generally improved following change in medication.  On exam she is in no distress, though given her description of several days of loose stool, her age, comorbidities there is some suspicion of dehydration.  The patient's labs are consistent with her prior studies.  Given the patient also notes that she feels well she was discharged in stable condition with instructions to follow up with her physician tomorrow to insure that she is taking the appropriate medication, and she was provided return precautions.    Gerhard Munch, MD 05/05/12 [id number]

## 2012-05-05 NOTE — ED Notes (Signed)
Pt has had diarrhea x 1 week. Was started on flagyl about 3 days ago. Diarrhea has continued. Pt referred to ed by pmd for further tx for possible dehydration. Pt accompanied duaghter, [name]

## 2012-05-05 NOTE — Discharge Instructions (Signed)
Please be sure to speak with your physician tomorrow to discuss how you did overnight, and to insure that you were taking the appropriate medication.  Return to the emergency department for any concerning changes in your condition.

## 2012-05-16 ENCOUNTER — Encounter (HOSPITAL_BASED_OUTPATIENT_CLINIC_OR_DEPARTMENT_OTHER): Payer: Medicare Other | Attending: Plastic Surgery

## 2012-05-16 DIAGNOSIS — Z85828 Personal history of other malignant neoplasm of skin: Secondary | ICD-10-CM | POA: Insufficient documentation

## 2012-05-16 DIAGNOSIS — E1169 Type 2 diabetes mellitus with other specified complication: Secondary | ICD-10-CM | POA: Insufficient documentation

## 2012-05-16 DIAGNOSIS — L97509 Non-pressure chronic ulcer of other part of unspecified foot with unspecified severity: Secondary | ICD-10-CM | POA: Insufficient documentation

## 2012-05-16 NOTE — Progress Notes (Signed)
[facility]  NAME:  [name], [name] NO.:  [id number]  MEDICAL RECORD NO.:  [id number]      DATE OF BIRTH:  1913-08-10  PHYSICIAN:  Wayland Denis, DO       VISIT DATE:  05/16/2012                                  OFFICE VISIT   SUBJECTIVE:  The patient is a 78 year old female who is here with her family and her caregiver for follow up on her right lower extremity diabetic foot ulcer secondary to malignancy.  She is doing extremely well.  The area has healed very well.  There was just a small area that is still open.  There has been no change in her medications or social history.  She is alert, oriented, and cooperative, pleasant as usual.  On exam, the wound is as described above.  There is just a little bit of swelling around the periwound area and the foot.  Her breathing is unlabored. Her heart is regular.  Her pupils are equal and reactive.  Recommend continuing with the Profore for at least 1 more week, hopefully after that, we can just go to compression and she will be finished.     Wayland Denis, DO     CS/MEDQ  D:  05/16/2012  T:  05/16/2012  Job:  [id number]

## 2012-05-19 ENCOUNTER — Ambulatory Visit: Payer: Medicare Other | Admitting: Physical Therapy

## 2012-05-24 ENCOUNTER — Ambulatory Visit: Payer: Medicare Other | Admitting: Physical Therapy

## 2012-05-24 NOTE — Progress Notes (Signed)
[facility]  NAME:  [name], [name] NO.:  MEDICAL RECORD NO.:  [id number]      DATE OF BIRTH:  [birth year]  PHYSICIAN:  Wayland Denis, DO       VISIT DATE:  05/23/2012                                  OFFICE VISIT   The patient is a 78 year old female who is here for followup on her right lower extremity ulcer.  She is doing remarkably well.  It is nearly all healed.  There is a little superficial area that still open, but overall she is doing much, much better.  No change in her review of systems or social history or medications.  On exam, she is alert, oriented, cooperative, not in any acute distress. She is pleasant.  Pupils are equal, extraocular muscles are intact.  Her breathing is unlabored.  Her heart is regular.  Wound is described as above.  We will continue with a little bit of collagen and an Ace wrap and see how she does for this week and hopefully, she will be healed by next.     Wayland Denis, DO     CS/MEDQ  D:  05/23/2012  T:  05/23/2012  Job:  [id number]

## 2012-05-25 ENCOUNTER — Ambulatory Visit: Payer: Medicare Other | Attending: Orthopedic Surgery | Admitting: Physical Therapy

## 2012-05-25 ENCOUNTER — Encounter (HOSPITAL_BASED_OUTPATIENT_CLINIC_OR_DEPARTMENT_OTHER): Payer: Medicare Other

## 2012-05-25 DIAGNOSIS — M25569 Pain in unspecified knee: Secondary | ICD-10-CM | POA: Insufficient documentation

## 2012-05-25 DIAGNOSIS — IMO0001 Reserved for inherently not codable concepts without codable children: Secondary | ICD-10-CM | POA: Insufficient documentation

## 2012-05-31 NOTE — Progress Notes (Signed)
[facility]  NAME:  [name], [name] NO.:  MEDICAL RECORD NO.:  [id number]      DATE OF BIRTH:  [birth year]  PHYSICIAN:  Wayland Denis, DO       VISIT DATE:  05/30/2012                                  OFFICE VISIT   The patient is a 78 year old female who is here for followup on her right lower extremity ulcer that was developed after excision of a cancer.  She is doing extremely well and at this point, it has completely healed.  There has been no change in her medications or social history.  On exam, she is alert, oriented, cooperative, not in any acute distress. Her caregiver is with her.  Her breathing is unlabored.  Her heart is regular.  Her abdomen is soft.  The wound is healed.  We recommend lotion with compression stockings or diabetic sock, and follow up as needed.     Wayland Denis, DO     CS/MEDQ  D:  05/30/2012  T:  05/30/2012  Job:  [id number]

## 2012-06-01 ENCOUNTER — Ambulatory Visit: Payer: Medicare Other | Admitting: Physical Therapy

## 2012-06-08 ENCOUNTER — Ambulatory Visit: Payer: Medicare Other | Admitting: Physical Therapy

## 2012-06-15 ENCOUNTER — Encounter: Payer: Medicare Other | Admitting: Physical Therapy

## 2012-06-22 ENCOUNTER — Encounter: Payer: Medicare Other | Admitting: Physical Therapy

## 2012-07-22 ENCOUNTER — Other Ambulatory Visit: Payer: Self-pay | Admitting: Dermatology

## 2012-09-12 ENCOUNTER — Other Ambulatory Visit: Payer: Self-pay | Admitting: Dermatology

## 2012-10-19 ENCOUNTER — Encounter: Payer: Self-pay | Admitting: Vascular Surgery

## 2012-10-20 ENCOUNTER — Encounter (INDEPENDENT_AMBULATORY_CARE_PROVIDER_SITE_OTHER): Payer: Medicare Other

## 2012-10-20 ENCOUNTER — Encounter: Payer: Self-pay | Admitting: Vascular Surgery

## 2012-10-20 ENCOUNTER — Ambulatory Visit (INDEPENDENT_AMBULATORY_CARE_PROVIDER_SITE_OTHER): Payer: Medicare Other | Admitting: Vascular Surgery

## 2012-10-20 VITALS — BP 155/60 | HR 68 | Resp 16 | Ht 61.0 in | Wt 98.6 lb

## 2012-10-20 DIAGNOSIS — I739 Peripheral vascular disease, unspecified: Secondary | ICD-10-CM

## 2012-10-20 DIAGNOSIS — M79609 Pain in unspecified limb: Secondary | ICD-10-CM

## 2012-10-20 NOTE — Progress Notes (Signed)
[facility] [place] HISTORY AND PHYSICAL   History of Present Illness:  Patient is a 78 y.o. year old female who presents for evaluation of pain swelling and redness right first and second toe. She has a known history of peripheral arterial disease and saw my partner Dr. Hart Rochester approximately 4 years ago. She had minimal symptoms at that time. She states that recently she had an ingrown toenail on the right foot. This was treated by the podiatrist but was slow to heal. It is currently not draining. Over the last few months she has had increasing pain in the right first an adjacent second toe. This can occur at rest. Sometimes it is worse at night. The patient walks with a walker. She is fairly independent but does have some caregivers that the cyst with duties around the house. She does not really ambulate enough to elicit claudication symptoms. She has had no prior interventions in her lower extremities. Other medical problems include coronary artery disease, hypertension, hyperlipidemia, diabetes, irregular heartbeat. All of these are currently controlled and followed by Dr. Waynard Edwards.  Past Medical History  Diagnosis Date  . Coronary atherosclerosis of unspecified type of vessel, native or graft   . Other and unspecified hyperlipidemia   . Unspecified essential hypertension   . Peripheral vascular disease, unspecified   . Unspecified deficiency anemia   . Chronic pancreatitis   . Type II or unspecified type diabetes mellitus without mention of complication, not stated as uncontrolled   . Unspecified hypothyroidism   . Osteoporosis, unspecified   . Calculus of kidney   . Palpitations     history of  . 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 Total hip arthroplasty   . Appendectomy   . Abdominal hysterectomy      Social History History  Substance Use Topics  . Smoking status: Never Smoker   .  Smokeless tobacco: Never Used  . Alcohol Use: No    Family History Family History  Problem Relation Age of Onset  . Coronary artery disease    . Diabetes      Allergies  Allergies  Allergen Reactions  . Ciprofloxacin   . Codeine   . Penicillins     REACTION: Edema  . Sulfamethoxazole W-Trimethoprim     REACTION: Throat closing     Current Outpatient Prescriptions  Medication Sig Dispense Refill  . amLODipine (NORVASC) 10 MG tablet Take 5 mg by mouth daily.      Marland Kitchen aspirin 81 MG tablet Take 81 mg by mouth daily as needed. For pain.      Marland Kitchen atenolol (TENORMIN) 25 MG tablet take 1 and 1/2 tablets by mouth twice a day  90 tablet  3  . Clopidogrel Bisulfate (PLAVIX PO) Take 75 mg by mouth daily.       . furosemide (LASIX) 20 MG tablet Take 10 mg by mouth daily.        Marland Kitchen gemfibrozil (LOPID) 600 MG tablet Take 600 mg by mouth 2 (two) times daily before a meal.        . insulin glargine (LANTUS) 100 UNIT/ML injection Inject 16 Units into the skin daily.       . isosorbide mononitrate (IMDUR) 30 MG 24 hr tablet TAKE 1 TABLET BY MOUTH TWICE A DAY  60 tablet  10  . levothyroxine (SYNTHROID, LEVOTHROID) 112 MCG tablet Take 112 mcg by mouth  daily.        . metFORMIN (GLUCOPHAGE) 500 MG tablet Take 500 mg by mouth 2 (two) times daily with a meal.        . nitroGLYCERIN (NITROSTAT) 0.4 MG SL tablet place 1 tablet under the tongue if needed for chest pain  25 tablet  3  . pantoprazole (PROTONIX) 40 MG tablet Take 40 mg by mouth 2 (two) times daily.        . potassium chloride (MICRO-K) 10 MEQ CR capsule Take 10 mEq by mouth 2 (two) times a week.       . pregabalin (LYRICA) 50 MG capsule Take 50 mg by mouth 2 (two) times daily.      . sitaGLIPtin (JANUVIA) 50 MG tablet Take 50 mg by mouth daily.          ROS:   General:  No weight loss, Fever, chills  HEENT: No recent headaches, no nasal bleeding, no visual changes, no sore throat  Neurologic: No dizziness, blackouts, seizures. No  recent symptoms of stroke or mini- stroke. No recent episodes of slurred speech, or temporary blindness.  Cardiac: No recent episodes of chest pain/pressure, no shortness of breath at rest.  No shortness of breath with exertion.  + irregular heartbeat  Vascular: No history of rest pain in feet.  No history of claudication.  No history of non-healing ulcer, No history of DVT   Pulmonary: No home oxygen, no productive cough, no hemoptysis,  No asthma or wheezing  Musculoskeletal:  [ ]  Arthritis, [ ]  Low back pain,  [ ]  Joint pain  Hematologic:No history of hypercoagulable state.  No history of easy bleeding.  Gastrointestinal: No hematochezia or melena,  No gastroesophageal reflux, no trouble swallowing  Urinary: [ ]  chronic Kidney disease, [ ]  on HD - [ ]  MWF or [ ]  TTHS, [ ]  Burning with urination, [ ]  Frequent urination, [ ]  Difficulty urinating;   Skin: No rashes  Psychological: No history of anxiety,  No history of depression   Physical Examination  Filed Vitals:   10/20/12 1251  BP: 155/60  Pulse: 68  Resp: 16  Height: 5\' 1"  (1.549 m)  Weight: 98 lb 9.6 oz (44.725 kg)    Body mass index is 18.63 kg/(m^2).  General:  Alert and oriented, no acute distress HEENT: Normal Neck: No bruit or JVD Pulmonary: Clear to auscultation bilaterally Cardiac: Regular Rate and Rhythm without murmur Abdomen: Soft, non-tender, non-distended, no mass, no scars Skin: No rash, plantar aspect of both feet is slightly dusky. Right first and second toe are erythematous from the proximal phalanx to the tip, no ulcer Extremity Pulses:  2+ radial, brachial, femoral, absent dorsalis pedis, posterior tibial pulses bilaterally Musculoskeletal: No deformity or edema  Neurologic: Upper and lower extremity motor 5/5 and symmetric  DATA: Review of the patient's renal function from May of 2013 shows a GFR of 75 with a normal creatinine. I reviewed a arterial duplex scan from inside imaging dated  09/19/2008. This showed high-grade bilateral superficial femoral artery stenosis.  I reviewed and interpreted the patient's ABI study from our office today. This showed noncompressible vessels bilaterally secondary to calcification. The patient had monophasic waveforms in the popliteal and tibial vessels suggestive of superficial femoral artery occlusive disease. 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 Difficult situation discussed with  the patient and her daughter today. The patient does not have very good perfusion to the right foot. However, she currently does not have gangrenous changes or nonhealing wounds. One option would be for consideration of control of her pain with medication. I discussed with the patient and her daughter today that although this would not cure the problem this would treat the symptoms. I also discussed with them the other option of considering an arteriogram with lower extremity runoff possible angioplasty and stenting or possible bypass if angioplasty and stenting was not possible. I discussed with them that in light of her age diabetes and abnormal GFR she may be at increased risk for contrast nephropathy. At this point they wish to discuss these options with Dr. Waynard Edwards. They will get back to me on whether or not they wish to pursue an arteriogram and possible intervention.   PLAN:  See above  Fabienne Bruns, MD [facility] [place] Office: [phone] Pager: [phone]

## 2012-10-25 ENCOUNTER — Other Ambulatory Visit: Payer: Self-pay

## 2012-10-25 DIAGNOSIS — I739 Peripheral vascular disease, unspecified: Secondary | ICD-10-CM

## 2012-10-25 DIAGNOSIS — M79609 Pain in unspecified limb: Secondary | ICD-10-CM

## 2012-10-27 ENCOUNTER — Encounter: Payer: Self-pay | Admitting: *Deleted

## 2012-10-27 ENCOUNTER — Other Ambulatory Visit: Payer: Self-pay | Admitting: *Deleted

## 2012-10-27 DIAGNOSIS — Z299 Encounter for prophylactic measures, unspecified: Secondary | ICD-10-CM

## 2012-10-27 MED ORDER — ACETYLCYSTEINE 20 % IN SOLN: RESPIRATORY_TRACT | Status: DC

## 2012-11-14 ENCOUNTER — Encounter (HOSPITAL_COMMUNITY): Payer: Self-pay | Admitting: Pharmacy Technician

## 2012-11-25 ENCOUNTER — Encounter (HOSPITAL_COMMUNITY)
Admission: RE | Disposition: A | Discharge status: Home / Self Care | Payer: Self-pay | Source: Ambulatory Visit | Attending: Vascular Surgery

## 2012-11-25 ENCOUNTER — Ambulatory Visit (HOSPITAL_COMMUNITY)
Admission: RE | Admit: 2012-11-25 | Discharge: 2012-11-25 | Disposition: A | Discharge status: Home / Self Care | Payer: Medicare Other | Source: Ambulatory Visit | Attending: Vascular Surgery | Admitting: Vascular Surgery

## 2012-11-25 DIAGNOSIS — L98499 Non-pressure chronic ulcer of skin of other sites with unspecified severity: Secondary | ICD-10-CM

## 2012-11-25 DIAGNOSIS — I739 Peripheral vascular disease, unspecified: Secondary | ICD-10-CM

## 2012-11-25 DIAGNOSIS — Z794 Long term (current) use of insulin: Secondary | ICD-10-CM | POA: Insufficient documentation

## 2012-11-25 DIAGNOSIS — E039 Hypothyroidism, unspecified: Secondary | ICD-10-CM | POA: Insufficient documentation

## 2012-11-25 DIAGNOSIS — Z79899 Other long term (current) drug therapy: Secondary | ICD-10-CM | POA: Insufficient documentation

## 2012-11-25 DIAGNOSIS — E119 Type 2 diabetes mellitus without complications: Secondary | ICD-10-CM | POA: Insufficient documentation

## 2012-11-25 DIAGNOSIS — I10 Essential (primary) hypertension: Secondary | ICD-10-CM | POA: Insufficient documentation

## 2012-11-25 DIAGNOSIS — M810 Age-related osteoporosis without current pathological fracture: Secondary | ICD-10-CM | POA: Insufficient documentation

## 2012-11-25 DIAGNOSIS — I2510 Atherosclerotic heart disease of native coronary artery without angina pectoris: Secondary | ICD-10-CM | POA: Insufficient documentation

## 2012-11-25 DIAGNOSIS — E785 Hyperlipidemia, unspecified: Secondary | ICD-10-CM | POA: Insufficient documentation

## 2012-11-25 DIAGNOSIS — D649 Anemia, unspecified: Secondary | ICD-10-CM | POA: Insufficient documentation

## 2012-11-25 HISTORY — PX: ABDOMINAL AORTAGRAM: SHX5454

## 2012-11-25 LAB — POCT I-STAT, CHEM 8
Calcium, Ion: 1.27 mmol/L (ref 1.13–1.30)
Chloride: 108 mEq/L (ref 96–112)
Glucose, Bld: 170 mg/dL — ABNORMAL HIGH (ref 70–99)
HCT: 37 % (ref 36.0–46.0)

## 2012-11-25 SURGERY — ABDOMINAL AORTAGRAM
Anesthesia: LOCAL

## 2012-11-25 MED ORDER — ONDANSETRON HCL 4 MG/2ML IJ SOLN: 4.0000 mg | Freq: Four times a day (QID) | INTRAMUSCULAR | Status: DC | PRN

## 2012-11-25 MED ORDER — SODIUM BICARBONATE 8.4 % IV SOLN
INTRAVENOUS | Status: AC
  Administered 2012-11-25: 10:00:00 via INTRAVENOUS
  Filled 2012-11-25: qty 1000

## 2012-11-25 MED ORDER — LABETALOL HCL 5 MG/ML IV SOLN: 10.0000 mg | INTRAVENOUS | Status: DC | PRN

## 2012-11-25 MED ORDER — TRAMADOL HCL 50 MG PO TABS: 50.0000 mg | ORAL_TABLET | Freq: Four times a day (QID) | ORAL | Status: DC | PRN

## 2012-11-25 MED ORDER — HEPARIN (PORCINE) IN NACL 2-0.9 UNIT/ML-% IJ SOLN
INTRAMUSCULAR | Status: AC
  Filled 2012-11-25: qty 500

## 2012-11-25 MED ORDER — HYDRALAZINE HCL 20 MG/ML IJ SOLN: 10.0000 mg | INTRAMUSCULAR | Status: DC | PRN

## 2012-11-25 MED ORDER — SODIUM BICARBONATE 8.4 % IV SOLN
INTRAVENOUS | Status: DC
  Filled 2012-11-25: qty 1000

## 2012-11-25 MED ORDER — METOPROLOL TARTRATE 1 MG/ML IV SOLN: 2.0000 mg | INTRAVENOUS | Status: DC | PRN

## 2012-11-25 MED ORDER — ACETAMINOPHEN 325 MG PO TABS: 325.0000 mg | ORAL_TABLET | ORAL | Status: DC | PRN

## 2012-11-25 MED ORDER — GUAIFENESIN-DM 100-10 MG/5ML PO SYRP: 15.0000 mL | ORAL_SOLUTION | ORAL | Status: DC | PRN

## 2012-11-25 MED ORDER — ACETAMINOPHEN 325 MG RE SUPP: 325.0000 mg | RECTAL | Status: DC | PRN

## 2012-11-25 MED ORDER — PHENOL 1.4 % MT LIQD: 1.0000 | OROMUCOSAL | Status: DC | PRN

## 2012-11-25 MED ORDER — SODIUM CHLORIDE 0.9 % IV SOLN
INTRAVENOUS | Status: DC
  Administered 2012-11-25: 09:00:00 via INTRAVENOUS

## 2012-11-25 MED ORDER — LIDOCAINE HCL (PF) 1 % IJ SOLN
INTRAMUSCULAR | Status: AC
  Filled 2012-11-25: qty 30

## 2012-11-25 NOTE — H&P (Signed)
[facility] [place]  HISTORY AND PHYSICAL  History of Present Illness: Patient is a 78 y.o. year old female who presents for evaluation of pain swelling and redness right first and second toe. She has a known history of peripheral arterial disease and saw my partner Dr. Hart Rochester approximately 4 years ago. She had minimal symptoms at that time. She states that recently she had an ingrown toenail on the right foot. This was treated by the podiatrist but was slow to heal. It is currently not draining. Over the last few months she has had increasing pain in the right first an adjacent second toe. This can occur at rest. Sometimes it is worse at night. The patient walks with a walker. She is fairly independent but does have some caregivers that assist with duties around the house. She does not really ambulate enough to elicit claudication symptoms. She has had no prior interventions in her lower extremities. Other medical problems include coronary artery disease, hypertension, hyperlipidemia, diabetes, irregular heartbeat. All of these are currently controlled and followed by Dr. Waynard Edwards.   Past Medical History   Diagnosis  Date   .  Coronary atherosclerosis of unspecified type of vessel, native or graft    .  Other and unspecified hyperlipidemia    .  Unspecified essential hypertension    .  Peripheral vascular disease, unspecified    .  Unspecified deficiency anemia    .  Chronic pancreatitis    .  Type II or unspecified type diabetes mellitus without mention of complication, not stated as uncontrolled    .  Unspecified hypothyroidism    .  Osteoporosis, unspecified    .  Calculus of kidney    .  Palpitations      history of   .  Squamous cell carcinoma in situ of skin of lower leg, right     Past Surgical History   Procedure  Date   .  Total hip arthroplasty    .  Appendectomy    .  Abdominal hysterectomy    Social History  History   Substance Use Topics   .  Smoking  status:  Never Smoker   .  Smokeless tobacco:  Never Used   .  Alcohol Use:  No   Family History  Family History   Problem  Relation  Age of Onset   .  Coronary artery disease     .  Diabetes     Allergies  Allergies   Allergen  Reactions   .  Ciprofloxacin    .  Codeine    .  Penicillins      REACTION: Edema   .  Sulfamethoxazole W-Trimethoprim      REACTION: Throat closing    Current Outpatient Prescriptions   Medication  Sig  Dispense  Refill   .  amLODipine (NORVASC) 10 MG tablet  Take 5 mg by mouth daily.     Marland Kitchen  aspirin 81 MG tablet  Take 81 mg by mouth daily as needed. For pain.     Marland Kitchen  atenolol (TENORMIN) 25 MG tablet  take 1 and 1/2 tablets by mouth twice a day  90 tablet  3   .  Clopidogrel Bisulfate (PLAVIX PO)  Take 75 mg by mouth daily.     .  furosemide (LASIX) 20 MG tablet  Take 10 mg by mouth daily.     Marland Kitchen  gemfibrozil (LOPID) 600 MG tablet  Take 600 mg by  mouth 2 (two) times daily before a meal.     .  insulin glargine (LANTUS) 100 UNIT/ML injection  Inject 16 Units into the skin daily.     .  isosorbide mononitrate (IMDUR) 30 MG 24 hr tablet  TAKE 1 TABLET BY MOUTH TWICE A DAY  60 tablet  10   .  levothyroxine (SYNTHROID, LEVOTHROID) 112 MCG tablet  Take 112 mcg by mouth daily.     .  metFORMIN (GLUCOPHAGE) 500 MG tablet  Take 500 mg by mouth 2 (two) times daily with a meal.     .  nitroGLYCERIN (NITROSTAT) 0.4 MG SL tablet  place 1 tablet under the tongue if needed for chest pain  25 tablet  3   .  pantoprazole (PROTONIX) 40 MG tablet  Take 40 mg by mouth 2 (two) times daily.     .  potassium chloride (MICRO-K) 10 MEQ CR capsule  Take 10 mEq by mouth 2 (two) times a week.     .  pregabalin (LYRICA) 50 MG capsule  Take 50 mg by mouth 2 (two) times daily.     .  sitaGLIPtin (JANUVIA) 50 MG tablet  Take 50 mg by mouth daily.      ROS:  General: No weight loss, Fever, chills  HEENT: No recent headaches, no nasal bleeding, no visual changes, no sore throat   Neurologic: No dizziness, blackouts, seizures. No recent symptoms of stroke or mini- stroke. No recent episodes of slurred speech, or temporary blindness.  Cardiac: No recent episodes of chest pain/pressure, no shortness of breath at rest. No shortness of breath with exertion. + irregular heartbeat  Vascular: No history of rest pain in feet. No history of claudication. No history of non-healing ulcer, No history of DVT  Pulmonary: No home oxygen, no productive cough, no hemoptysis, No asthma or wheezing  Musculoskeletal: [ ]  Arthritis, [ ]  Low back pain, [ ]  Joint pain  Hematologic:No history of hypercoagulable state. No history of easy bleeding.  Gastrointestinal: No hematochezia or melena, No gastroesophageal reflux, no trouble swallowing  Urinary: [ ]  chronic Kidney disease, [ ]  on HD - [ ]  MWF or [ ]  TTHS, [ ]  Burning with urination, [ ]  Frequent urination, [ ]  Difficulty urinating;  Skin: No rashes  Psychological: No history of anxiety, No history of depression   Physical Examination   General: Alert and oriented, no acute distress  HEENT: Normal  Neck: No bruit or JVD  Pulmonary: Clear to auscultation bilaterally  Cardiac: Regular Rate and Rhythm without murmur  Abdomen: Soft, non-tender, non-distended, no mass, no scars  Skin: No rash, plantar aspect of both feet is slightly dusky. Right first and second toe are erythematous from the proximal phalanx to the tip, no ulcer  Extremity Pulses: 2+ radial, brachial, femoral, absent dorsalis pedis, posterior tibial pulses bilaterally  Musculoskeletal: No deformity or edema  Neurologic: Upper and lower extremity motor 5/5 and symmetric   DATA: Review of the patient's renal function from May of 2013 shows a GFR of 75 with a normal creatinine. I reviewed a arterial duplex scan from inside imaging dated 09/19/2008. This showed high-grade bilateral superficial femoral artery stenosis.  I reviewed and interpreted the patient's ABI study from our  office today. This showed noncompressible vessels bilaterally secondary to calcification. The patient had monophasic waveforms in the popliteal and tibial vessels suggestive of superficial femoral artery occlusive disease. Toe pressure on the right was a toe pressure on the left was 38  ASSESSMENT: Right lower extremity ischemia most likely secondary to superficial femoral artery and tibial artery occlusive disease. Difficult situation discussed with the patient and her daughter today. The patient does not have very good perfusion to the right foot.  One option would be for consideration of control of her pain with medication. I discussed with the patient and her daughter today that although this would not cure the problem this would treat the symptoms. I also discussed with them the other option of considering an arteriogram with lower extremity runoff possible angioplasty and stenting or possible bypass if angioplasty and stenting was not possible. I discussed with them that in light of her age diabetes and abnormal GFR she may be at increased risk for contrast nephropathy. Arteriogram possible intervention today.  PLAN: See above  Fabienne Bruns, MD  [facility]  Office: [phone]  Pager: [phone]

## 2012-11-25 NOTE — Op Note (Signed)
Procedure: Aortogram with bilateral lower extremity runoff  Preoperative diagnosis: Peripheral arterial disease with right heel ulcer  Postoperative diagnosis: Same  Anesthesia local  Operative findings  : Diffuse athersclerosis bilaterally but with in line flow 1 vessel peroneal runoff right foot. 2 vessel runoff peroneal AT left foot.  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    Operative details: After obtaining informed consent, the patient was taken to the [facility]. The patient was placed in supine position on the Angio table. Both groins were prepped and draped in usual sterile fashion. Local anesthesia was infiltrated over the left common femoral artery. An introducer needle was used to cannulate the left common femoral artery and 035 versacore wire threaded into the abdominal aorta under fluoroscopic guidance. Next a 5 French sheath is placed over the guidewire and the left common femoral artery. A 5 French pigtail catheter was placed over the guidewire into the abdominal aorta and abdominal aortogram was obtained. The infrarenal abdominal aorta is patent. The left and right common internal and external iliac arteries are patent. The left and right renal arteries are patent.  There is a 70% stenosis of the celiac artery. The superior mesenteric artery is patent.  The catheter was then pulled up just above the aortic bifurcation and pelvic angiogram was obtained. The inferior mesenteric artery had a 70% stenosis.  This also shows the left and right common femoral arteries are patent. At this point it was noted that the patient was developing a hematoma around the sheath.  Hemostasis was obtained with 3 minutes of pressure.  Next a stepwise angiogram was performed of the right lower extremity. First the pigtail catheter was removed over a guidewire this was exchanged for a 5 [place] crossover catheter. The crossover catheter was used to selectively catheterize the right common iliac artery  and the guidewire advanced into the right distal external iliac artery. The crossover catheter was removed and replaced with a 5 French straight catheter. Angiogram was then performed the right lower extremity. This shows a patent common femoral profunda femoris artery.  The superficial femoral and popliteal arteries are diffusely diseased with multiple areas of 50-70% stenosis but they are patent.   There is 1 vessel runoff via a small peroneal artery to the foot. The anterior tibial and posterior tibial arteries are occluded. Next the 5 French straight catheter was removed over a guidewire.   A left lower extremity arteriogram was then performed through the sheath.  This shows a patent common femoral profunda femoris artery.  The superficial femoral and popliteal arteries are diffusely diseased with multiple areas of 50-70% stenosis but they are patent.   There is 2 vessel runoff via a small peroneal artery and the anterior tibial artery to the foot. The posterior tibial artery is occluded.  The 5 French sheath was left in place to be pulled in the holding area. The patient tolerated the procedure well and there were no complications. Patient was taken to the holding area in stable condition.  Operative management:  The patient has in line flow with diffuse atherosclerosis and tibial artery occlusive disease.  No focal area to treat percutaneously.  Would recommend protecting feet and medical management with pain control as needed.  No surgical or percutaneous intervention planned.  Fabienne Bruns, MD [facility] [place] Office: [phone] Pager: [phone]

## 2013-04-21 ENCOUNTER — Inpatient Hospital Stay (HOSPITAL_COMMUNITY)
Admission: EM | Admit: 2013-04-21 | Discharge: 2013-04-25 | DRG: 309 | Disposition: A | Discharge status: Home / Self Care | Payer: Medicare Other | Attending: Internal Medicine | Admitting: Internal Medicine

## 2013-04-21 ENCOUNTER — Emergency Department (HOSPITAL_COMMUNITY): Payer: Medicare Other

## 2013-04-21 ENCOUNTER — Encounter (HOSPITAL_COMMUNITY): Payer: Self-pay | Admitting: Emergency Medicine

## 2013-04-21 ENCOUNTER — Other Ambulatory Visit: Payer: Self-pay

## 2013-04-21 DIAGNOSIS — Z794 Long term (current) use of insulin: Secondary | ICD-10-CM

## 2013-04-21 DIAGNOSIS — Z880 Allergy status to penicillin: Secondary | ICD-10-CM

## 2013-04-21 DIAGNOSIS — Z66 Do not resuscitate: Secondary | ICD-10-CM | POA: Diagnosis present

## 2013-04-21 DIAGNOSIS — I10 Essential (primary) hypertension: Secondary | ICD-10-CM | POA: Diagnosis present

## 2013-04-21 DIAGNOSIS — E039 Hypothyroidism, unspecified: Secondary | ICD-10-CM | POA: Diagnosis present

## 2013-04-21 DIAGNOSIS — E785 Hyperlipidemia, unspecified: Secondary | ICD-10-CM | POA: Diagnosis present

## 2013-04-21 DIAGNOSIS — E119 Type 2 diabetes mellitus without complications: Secondary | ICD-10-CM | POA: Diagnosis present

## 2013-04-21 DIAGNOSIS — I739 Peripheral vascular disease, unspecified: Secondary | ICD-10-CM | POA: Diagnosis present

## 2013-04-21 DIAGNOSIS — Z79899 Other long term (current) drug therapy: Secondary | ICD-10-CM

## 2013-04-21 DIAGNOSIS — Z881 Allergy status to other antibiotic agents status: Secondary | ICD-10-CM

## 2013-04-21 DIAGNOSIS — I4891 Unspecified atrial fibrillation: Principal | ICD-10-CM | POA: Diagnosis present

## 2013-04-21 DIAGNOSIS — D539 Nutritional anemia, unspecified: Secondary | ICD-10-CM | POA: Diagnosis present

## 2013-04-21 DIAGNOSIS — D696 Thrombocytopenia, unspecified: Secondary | ICD-10-CM | POA: Diagnosis present

## 2013-04-21 DIAGNOSIS — Z96649 Presence of unspecified artificial hip joint: Secondary | ICD-10-CM

## 2013-04-21 DIAGNOSIS — K861 Other chronic pancreatitis: Secondary | ICD-10-CM | POA: Diagnosis present

## 2013-04-21 DIAGNOSIS — Z888 Allergy status to other drugs, medicaments and biological substances status: Secondary | ICD-10-CM

## 2013-04-21 DIAGNOSIS — I252 Old myocardial infarction: Secondary | ICD-10-CM

## 2013-04-21 DIAGNOSIS — Z833 Family history of diabetes mellitus: Secondary | ICD-10-CM

## 2013-04-21 DIAGNOSIS — R079 Chest pain, unspecified: Secondary | ICD-10-CM | POA: Diagnosis present

## 2013-04-21 DIAGNOSIS — Z7901 Long term (current) use of anticoagulants: Secondary | ICD-10-CM

## 2013-04-21 DIAGNOSIS — M810 Age-related osteoporosis without current pathological fracture: Secondary | ICD-10-CM | POA: Diagnosis present

## 2013-04-21 DIAGNOSIS — I509 Heart failure, unspecified: Secondary | ICD-10-CM | POA: Diagnosis present

## 2013-04-21 DIAGNOSIS — I5022 Chronic systolic (congestive) heart failure: Secondary | ICD-10-CM | POA: Diagnosis present

## 2013-04-21 DIAGNOSIS — I2510 Atherosclerotic heart disease of native coronary artery without angina pectoris: Secondary | ICD-10-CM | POA: Diagnosis present

## 2013-04-21 DIAGNOSIS — Z8249 Family history of ischemic heart disease and other diseases of the circulatory system: Secondary | ICD-10-CM

## 2013-04-21 DIAGNOSIS — I059 Rheumatic mitral valve disease, unspecified: Secondary | ICD-10-CM

## 2013-04-21 DIAGNOSIS — D638 Anemia in other chronic diseases classified elsewhere: Secondary | ICD-10-CM | POA: Diagnosis present

## 2013-04-21 HISTORY — DX: Non-ST elevation (NSTEMI) myocardial infarction: I21.4

## 2013-04-21 LAB — CBC
MCHC: 33.6 g/dL (ref 30.0–36.0)
Platelets: 119 10*3/uL — ABNORMAL LOW (ref 150–400)
RDW: 14.1 % (ref 11.5–15.5)
WBC: 7.5 10*3/uL (ref 4.0–10.5)

## 2013-04-21 LAB — COMPREHENSIVE METABOLIC PANEL
ALT: 6 U/L (ref 0–35)
Alkaline Phosphatase: 49 U/L (ref 39–117)
BUN: 18 mg/dL (ref 6–23)
CO2: 26 mEq/L (ref 19–32)
GFR calc Af Amer: 87 mL/min — ABNORMAL LOW (ref >90)
GFR calc non Af Amer: 75 mL/min — ABNORMAL LOW (ref >90)
Glucose, Bld: 187 mg/dL — ABNORMAL HIGH (ref 70–99)
Potassium: 3.7 mEq/L (ref 3.5–5.1)
Sodium: 140 mEq/L (ref 135–145)

## 2013-04-21 LAB — CBC WITH DIFFERENTIAL/PLATELET
Eosinophils Absolute: 0.2 10*3/uL (ref 0.0–0.7)
HCT: 35.3 % — ABNORMAL LOW (ref 36.0–46.0)
Hemoglobin: 11.8 g/dL — ABNORMAL LOW (ref 12.0–15.0)
Lymphs Abs: 2.4 10*3/uL (ref 0.7–4.0)
MCH: 32.9 pg (ref 26.0–34.0)
MCHC: 33.4 g/dL (ref 30.0–36.0)
MCV: 98.3 fL (ref 78.0–100.0)
Monocytes Absolute: 0.7 10*3/uL (ref 0.1–1.0)
Monocytes Relative: 8 % (ref 3–12)
Neutrophils Relative %: 64 % (ref 43–77)
RBC: 3.59 MIL/uL — ABNORMAL LOW (ref 3.87–5.11)

## 2013-04-21 LAB — POCT I-STAT, CHEM 8
BUN: 19 mg/dL (ref 6–23)
Calcium, Ion: 1.1 mmol/L — ABNORMAL LOW (ref 1.13–1.30)
Chloride: 105 mEq/L (ref 96–112)
Creatinine, Ser: 0.6 mg/dL (ref 0.50–1.10)

## 2013-04-21 LAB — TSH: TSH: 1.324 u[IU]/mL (ref 0.350–4.500)

## 2013-04-21 LAB — GLUCOSE, CAPILLARY
Glucose-Capillary: 141 mg/dL — ABNORMAL HIGH (ref 70–99)
Glucose-Capillary: 159 mg/dL — ABNORMAL HIGH (ref 70–99)

## 2013-04-21 LAB — POCT I-STAT TROPONIN I

## 2013-04-21 LAB — MRSA PCR SCREENING: MRSA by PCR: NEGATIVE

## 2013-04-21 MED ORDER — LINAGLIPTIN 5 MG PO TABS
5.0000 mg | ORAL_TABLET | Freq: Every day | ORAL | Status: DC
  Administered 2013-04-21 – 2013-04-25 (×5): 5 mg via ORAL
  Filled 2013-04-21 (×5): qty 1

## 2013-04-21 MED ORDER — HYDROCODONE-ACETAMINOPHEN 5-325 MG PO TABS: 1.0000 | ORAL_TABLET | ORAL | Status: DC | PRN

## 2013-04-21 MED ORDER — PREGABALIN 25 MG PO CAPS
50.0000 mg | ORAL_CAPSULE | Freq: Two times a day (BID) | ORAL | Status: DC
  Administered 2013-04-21 – 2013-04-25 (×9): 50 mg via ORAL
  Filled 2013-04-21: qty 2
  Filled 2013-04-21: qty 1
  Filled 2013-04-21 (×7): qty 2

## 2013-04-21 MED ORDER — APIXABAN 2.5 MG PO TABS
2.5000 mg | ORAL_TABLET | Freq: Two times a day (BID) | ORAL | Status: DC
  Administered 2013-04-22 – 2013-04-25 (×7): 2.5 mg via ORAL
  Filled 2013-04-21 (×9): qty 1

## 2013-04-21 MED ORDER — NITROGLYCERIN 0.4 MG SL SUBL: 0.4000 mg | SUBLINGUAL_TABLET | SUBLINGUAL | Status: DC | PRN

## 2013-04-21 MED ORDER — ACETAMINOPHEN 650 MG RE SUPP: 650.0000 mg | Freq: Four times a day (QID) | RECTAL | Status: DC | PRN

## 2013-04-21 MED ORDER — ALBUTEROL SULFATE (5 MG/ML) 0.5% IN NEBU: 2.5000 mg | INHALATION_SOLUTION | RESPIRATORY_TRACT | Status: DC | PRN

## 2013-04-21 MED ORDER — INSULIN GLARGINE 100 UNIT/ML ~~LOC~~ SOLN
14.0000 [IU] | Freq: Every day | SUBCUTANEOUS | Status: DC
  Administered 2013-04-21 – 2013-04-25 (×5): 14 [IU] via SUBCUTANEOUS
  Filled 2013-04-21 (×5): qty 0.14

## 2013-04-21 MED ORDER — GEMFIBROZIL 600 MG PO TABS
600.0000 mg | ORAL_TABLET | Freq: Every day | ORAL | Status: DC
  Administered 2013-04-21 – 2013-04-25 (×5): 600 mg via ORAL
  Filled 2013-04-21 (×5): qty 1

## 2013-04-21 MED ORDER — APIXABAN 2.5 MG PO TABS: 2.5000 mg | ORAL_TABLET | Freq: Two times a day (BID) | ORAL | Status: DC

## 2013-04-21 MED ORDER — ASPIRIN EC 81 MG PO TBEC
81.0000 mg | DELAYED_RELEASE_TABLET | Freq: Every day | ORAL | Status: DC
  Administered 2013-04-21: 81 mg via ORAL
  Filled 2013-04-21: qty 1

## 2013-04-21 MED ORDER — DILTIAZEM HCL ER COATED BEADS 240 MG PO CP24
240.0000 mg | ORAL_CAPSULE | Freq: Every day | ORAL | Status: DC
  Administered 2013-04-21: 240 mg via ORAL
  Filled 2013-04-21 (×2): qty 1

## 2013-04-21 MED ORDER — POTASSIUM CHLORIDE CRYS ER 10 MEQ PO TBCR
10.0000 meq | EXTENDED_RELEASE_TABLET | Freq: Two times a day (BID) | ORAL | Status: DC
  Administered 2013-04-21 – 2013-04-25 (×9): 10 meq via ORAL
  Filled 2013-04-21 (×10): qty 1

## 2013-04-21 MED ORDER — DILTIAZEM HCL 100 MG IV SOLR
5.0000 mg/h | INTRAVENOUS | Status: DC
  Filled 2013-04-21: qty 100

## 2013-04-21 MED ORDER — METFORMIN HCL 500 MG PO TABS
1000.0000 mg | ORAL_TABLET | Freq: Two times a day (BID) | ORAL | Status: DC
  Administered 2013-04-21 – 2013-04-25 (×9): 1000 mg via ORAL
  Filled 2013-04-21 (×11): qty 2

## 2013-04-21 MED ORDER — FUROSEMIDE 20 MG PO TABS
20.0000 mg | ORAL_TABLET | Freq: Every day | ORAL | Status: DC
  Administered 2013-04-21 – 2013-04-25 (×5): 20 mg via ORAL
  Filled 2013-04-21 (×5): qty 1

## 2013-04-21 MED ORDER — DILTIAZEM HCL 25 MG/5ML IV SOLN
5.0000 mg | Freq: Once | INTRAVENOUS | Status: AC
  Administered 2013-04-21: 5 mg via INTRAVENOUS
  Filled 2013-04-21: qty 5

## 2013-04-21 MED ORDER — CLOPIDOGREL BISULFATE 75 MG PO TABS
75.0000 mg | ORAL_TABLET | Freq: Every day | ORAL | Status: DC
  Administered 2013-04-21: 75 mg via ORAL
  Filled 2013-04-21: qty 1

## 2013-04-21 MED ORDER — DEXTROSE 5 % IV SOLN
5.0000 mg/h | INTRAVENOUS | Status: DC
  Administered 2013-04-21: 5 mg/h via INTRAVENOUS

## 2013-04-21 MED ORDER — LEVOTHYROXINE SODIUM 112 MCG PO TABS
112.0000 ug | ORAL_TABLET | Freq: Every day | ORAL | Status: DC
  Administered 2013-04-21 – 2013-04-25 (×5): 112 ug via ORAL
  Filled 2013-04-21 (×6): qty 1

## 2013-04-21 MED ORDER — SODIUM CHLORIDE 0.9 % IV SOLN
INTRAVENOUS | Status: DC
  Administered 2013-04-21: 1000 mL via INTRAVENOUS
  Administered 2013-04-21: 22:00:00 via INTRAVENOUS

## 2013-04-21 MED ORDER — SODIUM CHLORIDE 0.9 % IJ SOLN
3.0000 mL | Freq: Two times a day (BID) | INTRAMUSCULAR | Status: DC
  Administered 2013-04-22 – 2013-04-25 (×7): 3 mL via INTRAVENOUS

## 2013-04-21 MED ORDER — ACETAMINOPHEN 325 MG PO TABS
650.0000 mg | ORAL_TABLET | Freq: Four times a day (QID) | ORAL | Status: DC | PRN
  Filled 2013-04-21: qty 2

## 2013-04-21 MED ORDER — ONDANSETRON HCL 4 MG/2ML IJ SOLN: 4.0000 mg | Freq: Four times a day (QID) | INTRAMUSCULAR | Status: DC | PRN

## 2013-04-21 MED ORDER — MORPHINE SULFATE 2 MG/ML IJ SOLN: 1.0000 mg | INTRAMUSCULAR | Status: DC | PRN

## 2013-04-21 MED ORDER — ONDANSETRON HCL 4 MG PO TABS: 4.0000 mg | ORAL_TABLET | Freq: Four times a day (QID) | ORAL | Status: DC | PRN

## 2013-04-21 NOTE — H&P (Signed)
[facility] History and Physical  Konstantina Nachreiner ZOX:[id number] DOB: 18-Jun-1913 DOA: 04/21/2013  Referring physician: ER physician PCP: Ezequiel Kayser, MD   Chief Complaint: chest pain  HPI:  79 year old female with past medical history of CAD, hypothyroidism, diabetes, dyslipidemia and hypertension (on atenolol, Norvasc and Imdur) who presented to [facility] ED with worsening retrosternal chest pain started today prior to this admission. Chest pain is 7-8/10 in intensity and radiating to left arm. Pain is constant and associated with palpitations. Patient reported pain at rest and with exertion. Pain is relieved with SL nitroglycerin. No aggravating factors. No fevers or chills. No cough. No abdominal pain, no nausea or vomiting. No reports of lightheadedness or loss of consciousness. No reports of blood in stool or urine. In ED, patient was found to have BP of 94/51 with HR 117 - 133. Her 12 lead EKG revealed atrial fibrillation with rapid ventricular rate. She was started on Cardizem drip. Her CBC showed mild anemia with hemoglobin of 11.8. CXR showed stable cardiomegaly and no acute cardiopulmonary process.  Assessment and Plan:  Principal Problem:   Chest pain - secondary to atrial fibrillation; rule out ACS - cycle cardiac enzymes - obtain 2 D ECHO and BNP; 2 D ECHO in 2009 showed EF of 45% - chest pain is improved with nitroglycerin so will keep order in place; add morphine 1 mg Q 4 hours PRN IV for severe pain - oxygen support via nasal canula - continue aspirin and plavix Active Problems:   Atrial fibrillation - on Cardizem drip - may need cardio consult in am if no spontaneous conversion to NSR - check TSH   HYPOTHYROIDISM - continue levothyroxine   DIABETES MELLITUS, TYPE II WITH COMPLICATIONS OF NEUROPATHY - continue insulin 14 units daily and metformin and tradjenta  - continue pregabalin for neuropathy     HYPERLIPIDEMIA - continue gemfibrozil   ANEMIA, CHRONIC -  hemoglobin stable on admission   HYPERTENSION - antihypertensive meds on hold (norvasc, Imdur and atenolol) as patient is on Cardizem drip and BP 94/51   CAD - on aspirin and plavix   Thrombocytopenia - unclear etiology - follow up CBC in am - use SCD's for DVT prophylaxis  Code Status: Full Family Communication: Pt at bedside Disposition Plan: Admit for further evaluation  Manson Passey, MD  [facility] Pager [phone]  If 7PM-7AM, please contact night-coverage [email or web address] Password [email or web address] 04/21/2013, 3:15 AM  Review of Systems:  Constitutional: Negative for fever, chills and malaise/fatigue. Negative for diaphoresis.  HENT: Negative for hearing loss, ear pain, nosebleeds, congestion, sore throat, neck pain, tinnitus and ear discharge.  Eyes: Negative for blurred vision, double vision, photophobia, pain, discharge and redness.  Respiratory: Negative for cough, hemoptysis, sputum production, shortness of breath, wheezing and stridor.   Cardiovascular: positive for chest pain, palpitations, no orthopnea, claudication and leg swelling.  Gastrointestinal: Negative for nausea, vomiting and abdominal pain. Negative for heartburn, constipation, blood in stool and melena.  Genitourinary: Negative for dysuria, urgency, frequency, hematuria and flank pain.  Musculoskeletal: Negative for myalgias, back pain, joint pain and falls.  Skin: Negative for itching and rash.  Neurological: Negative for dizziness and weakness. Negative for tingling, tremors, sensory change, speech change, focal weakness, loss of consciousness and headaches.  Endo/Heme/Allergies: Negative for environmental allergies and polydipsia. Does not bruise/bleed easily.  Psychiatric/Behavioral: Negative for suicidal ideas. The patient is not nervous/anxious.      Past Medical History  Diagnosis Date  . Coronary atherosclerosis of unspecified type of vessel,  native or graft   . Other and unspecified hyperlipidemia   . Unspecified  essential hypertension   . Peripheral vascular disease, unspecified   . Unspecified deficiency anemia   . Chronic pancreatitis   . Type II or unspecified type diabetes mellitus without mention of complication, not stated as uncontrolled   . Unspecified hypothyroidism   . Osteoporosis, unspecified   . Calculus of kidney   . Palpitations     history of  . Squamous cell carcinoma in situ of skin of lower leg, right    Past Surgical History  Procedure Laterality Date  . Total hip arthroplasty    . Appendectomy    . Abdominal hysterectomy     Social History:  reports that she has never smoked. She has never used smokeless tobacco. She reports that she does not drink alcohol or use illicit drugs.  Allergies  Allergen Reactions  . Ciprofloxacin   . Codeine   . Penicillins     REACTION: Edema  . Sulfamethoxazole W-Trimethoprim     REACTION: Throat closing    Family History:  Family History  Problem Relation Age of Onset  . Coronary artery disease    . Diabetes       Prior to Admission medications   Medication Sig Start Date End Date Taking? Authorizing Provider  amLODipine (NORVASC) 10 MG tablet Take 5 mg by mouth daily.   Yes Historical Provider, MD  aspirin EC 81 MG tablet Take 81 mg by mouth daily.   Yes Historical Provider, MD  atenolol (TENORMIN) 25 MG tablet Take 37.5 mg by mouth 2 (two) times daily.   Yes Historical Provider, MD  clopidogrel (PLAVIX) 75 MG tablet Take 75 mg by mouth daily.   Yes Historical Provider, MD  furosemide (LASIX) 20 MG tablet Take 10 mg by mouth daily.     Yes Historical Provider, MD  gemfibrozil (LOPID) 600 MG tablet Take 600 mg by mouth daily.    Yes Historical Provider, MD  insulin glargine (LANTUS) 100 UNIT/ML injection Inject 14 Units into the skin daily.    Yes Historical Provider, MD  isosorbide mononitrate (IMDUR) 30 MG 24 hr tablet Take 30 mg by mouth 2 (two) times daily.   Yes Historical Provider, MD  levothyroxine (SYNTHROID,  LEVOTHROID) 112 MCG tablet Take 112 mcg by mouth daily.     Yes Historical Provider, MD  metFORMIN (GLUCOPHAGE) 1000 MG tablet Take 1,000 mg by mouth 2 (two) times daily with a meal.   Yes Historical Provider, MD  nitroGLYCERIN (NITROSTAT) 0.4 MG SL tablet Place 0.4 mg under the tongue every 5 (five) minutes as needed for chest pain.   Yes Historical Provider, MD  potassium chloride (MICRO-K) 10 MEQ CR capsule Take 10 mEq by mouth 2 (two) times a week. Tuesday and Thursday   Yes Historical Provider, MD  pregabalin (LYRICA) 50 MG capsule Take 50 mg by mouth 2 (two) times daily.   Yes Historical Provider, MD  sitaGLIPtin (JANUVIA) 50 MG tablet Take 50 mg by mouth daily.   Yes Historical Provider, MD   Physical Exam: Filed Vitals:   04/21/13 0215 04/21/13 0230 04/21/13 0245 04/21/13 0300  BP:  94/51  108/52  Pulse: 130 126 125 130  Temp:      TempSrc:      Resp: 18 19 22 23   SpO2: 98% 96% 96% 98%    Physical Exam  Constitutional: Appears well-developed and well-nourished. No distress.  HENT: Normocephalic. Dry mucus membranes Eyes: Conjunctivae and  EOM are normal. PERRLA, no scleral icterus.  Neck: Normal ROM. Neck supple. No JVD.  CVS: irregular rhythm, tachycardic, (+) S1 and S2  Pulmonary: Effort and breath sounds normal, no stridor, rhonchi, wheezes, rales.  Abdominal: Soft. BS +,  no distension, tenderness, rebound or guarding.  Musculoskeletal: Normal range of motion. No edema and no tenderness.  Lymphadenopathy: No lymphadenopathy noted, cervical, inguinal. Neuro: Alert. Normal reflexes, muscle tone coordination. No cranial nerve deficit. Skin: Skin is warm and dry. No rash noted. Not diaphoretic. No erythema. No pallor.   Labs on Admission:  Basic Metabolic Panel:  Recent Labs Lab 04/21/13 0048  NA 140  K 3.7  CL 105  GLUCOSE 151*  BUN 19  CREATININE 0.60   CBC:  Recent Labs Lab 04/21/13 0037 04/21/13 0048  WBC 9.6  --   NEUTROABS 6.1  --   HGB 11.8* 11.9*   HCT 35.3* 35.0*  MCV 98.3  --   PLT 128*  --    Cardiac Enzymes: No results found for this basename: CKTOTAL, CKMB, CKMBINDEX, TROPONINI,  in the last 168 hours BNP: No components found with this basename: POCBNP,  CBG: No results found for this basename: GLUCAP,  in the last 168 hours  Radiological Exams on Admission: Dg Chest 2 View 04/21/2013  * IMPRESSION: Stable cardiomegaly.  No acute cardiopulmonary abnormality.   Original Report Authenticated By: Erskine Speed, M.D.     EKG: atrial fibrillation with RVR  Time spent: 75 minutes

## 2013-04-21 NOTE — Progress Notes (Signed)
  Echocardiogram 2D Echocardiogram has been performed.  Jaelin Devincentis 04/21/2013, 1:44 PM

## 2013-04-21 NOTE — Progress Notes (Signed)
Utilization review completed.  

## 2013-04-21 NOTE — Evaluation (Signed)
Physical Therapy Evaluation Patient Details Name: [name] MRN: [id number] DOB: 1913-01-24 Today's Date: 04/21/2013 Time: 7829-5621 PT Time Calculation (min): 25 min  PT Assessment / Plan / Recommendation Clinical Impression  79 y.o. female admitted to [facility] for chest pain, found to be in A-fib with RVR.  Rate more controlled now resting in the 70s, but it did at one point get up to 120 bpm with gait.      PT Assessment  Patient needs continued PT services    Follow Up Recommendations  Home health PT;Supervision/Assistance - 24 hour    Does the patient have the potential to tolerate intense rehabilitation     NA  Barriers to Discharge None None    Equipment Recommendations  None recommended by PT    Recommendations for Other Services   none  Frequency Min 3X/week    Precautions / Restrictions Precautions Precautions: Fall   Pertinent Vitals/Pain See vitals flow sheet.       Mobility  Bed Mobility Bed Mobility: Supine to Sit;Sitting - Scoot to Edge of Bed Supine to Sit: 5: Supervision;With rails;HOB flat Sitting - Scoot to Edge of Bed: 5: Supervision;With rail Details for Bed Mobility Assistance: very slow with multiple attempts needed to do it on her own.   Transfers Transfers: Sit to Stand;Stand to Sit Sit to Stand: 5: Supervision Stand to Sit: 5: Supervision Details for Transfer Assistance: supervision for safety due to pt reports feeling weak on her feet Ambulation/Gait Ambulation/Gait Assistance: 4: Min assist Ambulation Distance (Feet): 20 Feet Assistive device: Rolling walker Ambulation/Gait Assistance Details: min assist to support pt's trunk over weak legs and for balance.   Gait Pattern: Step-through pattern;Shuffle;Trunk flexed Gait velocity: less than 1.8 ft/sec which indicates risk for recurrent falls        PT Diagnosis: Difficulty walking;Abnormality of gait;Generalized weakness  PT Problem List: Decreased strength;Decreased activity  tolerance;Decreased balance;Decreased mobility PT Treatment Interventions: DME instruction;Gait training;Stair training;Functional mobility training;Therapeutic activities;Therapeutic exercise;Balance training;Neuromuscular re-education;Patient/family education   PT Goals Acute Rehab PT Goals PT Goal Formulation: With patient Time For Goal Achievement: 05/05/13 Potential to Achieve Goals: Good Pt will go Supine/Side to Sit: with modified independence PT Goal: Supine/Side to Sit - Progress: Goal set today Pt will go Sit to Supine/Side: with modified independence;with HOB 0 degrees PT Goal: Sit to Supine/Side - Progress: Goal set today Pt will go Sit to Stand: with supervision;with upper extremity assist PT Goal: Sit to Stand - Progress: Goal set today Pt will go Stand to Sit: with supervision;with upper extremity assist PT Goal: Stand to Sit - Progress: Goal set today Pt will Transfer Bed to Chair/Chair to Bed: with supervision PT Transfer Goal: Bed to Chair/Chair to Bed - Progress: Goal set today Pt will Ambulate: 51 - 150 feet;with supervision;with rolling walker PT Goal: Ambulate - Progress: Goal set today  Visit Information  Last PT Received On: 04/21/13 Assistance Needed: +1    Subjective Data  Subjective: Pt reports that she has 24 hour supervision from 3 aids at home.   Patient Stated Goal: to go home, "hopefully tomorrow"   Prior Functioning  Home Living Lives With: Alone Available Help at Discharge: Personal care attendant;Available 24 hours/day (has aids 24/7) Type of Home: House Home Access: Level entry Home Layout: One level Home Adaptive Equipment: Walker - rolling;Wheelchair - manual Prior Function Level of Independence: Needs assistance Needs Assistance: Bathing;Dressing;Meal Prep;Light Housekeeping Bath: Minimal Dressing: Minimal Meal Prep: Total Light Housekeeping: Total Communication Communication: HOH    Cognition  Cognition Arousal/Alertness:  Awake/alert Behavior During Therapy: WFL for tasks assessed/performed Overall Cognitive Status: Within Functional Limits for tasks assessed    Extremity/Trunk Assessment Right Lower Extremity Assessment RLE ROM/Strength/Tone: Deficits RLE ROM/Strength/Tone Deficits: 3/5 per gross functional assessment.  No obvious asymmetries noted R vs L legs Left Lower Extremity Assessment LLE ROM/Strength/Tone: Deficits LLE ROM/Strength/Tone Deficits: 3/5 per gross functional assessment.  No obvious asymmetries noted.       End of Session PT - End of Session Activity Tolerance: Patient limited by fatigue Patient left: in chair;with call bell/phone within reach Nurse Communication: Mobility status    Lurena Joiner B. Tasmin Exantus, PT, DPT [phone]   04/21/2013, 4:07 PM

## 2013-04-21 NOTE — Progress Notes (Signed)
Patient being transferred to unit 4700 per MD order, Report called to Sharlynn Oliphant, RN.

## 2013-04-21 NOTE — Consult Note (Signed)
CARDIOLOGY CONSULT NOTE   Patient ID: [name] MRN: [id number] DOB/AGE: Jul 11, 1913 79 y.o.  Admit date: 04/21/2013  Primary Physician   Ezequiel Kayser, MD Primary Cardiologist   TW, last seen 2012 Reason for Consultation   Atrial fib, anticoagulation  [name] [name] is a 79 y.o. female with a history of CAD and atrial fibrillation.  She was in her usual state of health last pm when she went to bed. She woke at 11:30 or so with chest tightness and feeling her heart rate rapid and irregular. She took a sublingual nitroglycerin with the help of her caregiver. Her caregiver checked her blood pressure and when her chest tightness did not improve, she called EMS. EMS gave her 2 more nitroglycerin and aspirin 324 mg. Her chest tightness improved somewhat. In the ER, she was given IV Cardizem and her heart rate improved. With improvement in her heart rate, her chest tightness resolved. She was changed to PO Cardizem 240 mg. She is still in atrial fibrillation but her rate is controlled.    Past Medical History  Diagnosis Date  . NSTEMI (non-ST elevated myocardial infarction) 2009    Treated medically, never cathed, EF 45%  . Other and unspecified hyperlipidemia   . Unspecified essential hypertension   . Peripheral vascular disease, unspecified   . Unspecified deficiency anemia   . Chronic pancreatitis   . Type II or unspecified type diabetes mellitus without mention of complication, not stated as uncontrolled   . Unspecified hypothyroidism   . Osteoporosis, unspecified   . Calculus of kidney   . Palpitations     history of  . Squamous cell carcinoma in situ of skin of lower leg, right     Past Surgical History  Procedure Laterality Date  . Total hip arthroplasty    . Appendectomy    . Abdominal hysterectomy     Allergies  Allergen Reactions  . Ciprofloxacin   . Codeine   . Penicillins     REACTION: Edema  . Sulfamethoxazole W-Trimethoprim     REACTION: Throat closing    I have reviewed the patient's current medications . aspirin EC  81 mg Oral Daily  . clopidogrel  75 mg Oral Daily  . diltiazem  240 mg Oral Daily  . gemfibrozil  600 mg Oral Daily  . insulin glargine  14 Units Subcutaneous Daily  . levothyroxine  112 mcg Oral QAC breakfast  . linagliptin  5 mg Oral Daily  . metFORMIN  1,000 mg Oral BID WC  . potassium chloride  10 mEq Oral BID  . pregabalin  50 mg Oral BID  . sodium chloride  3 mL Intravenous Q12H   . sodium chloride 50 mL/hr at 04/21/13 0900  . diltiazem (CARDIZEM) infusion 5 mg/hr (04/21/13 1045)   acetaminophen, acetaminophen, albuterol, HYDROcodone-acetaminophen, morphine injection, nitroGLYCERIN, ondansetron (ZOFRAN) IV, ondansetron  Medication Sig  amLODipine (NORVASC) 10 MG tablet Take 5 mg by mouth daily.  aspirin EC 81 MG tablet Take 81 mg by mouth daily.  atenolol (TENORMIN) 25 MG tablet Take 37.5 mg by mouth 2 (two) times daily.  clopidogrel (PLAVIX) 75 MG tablet Take 75 mg by mouth daily.  furosemide (LASIX) 20 MG tablet Take 10 mg by mouth daily.    gemfibrozil (LOPID) 600 MG tablet Take 600 mg by mouth daily.   insulin glargine (LANTUS) 100 UNIT/ML injection Inject 14 Units into the skin daily.   isosorbide mononitrate (IMDUR) 30 MG 24 hr tablet Take 30 mg by mouth 2 (  two) times daily.  levothyroxine (SYNTHROID, LEVOTHROID) 112 MCG tablet Take 112 mcg by mouth daily.    metFORMIN (GLUCOPHAGE) 1000 MG tablet Take 1,000 mg by mouth 2 (two) times daily with a meal.  nitroGLYCERIN (NITROSTAT) 0.4 MG SL tablet Place 0.4 mg under the tongue every 5 (five) minutes as needed for chest pain.  potassium chloride (MICRO-K) 10 MEQ CR capsule Take 10 mEq by mouth 2 (two) times a week. Tuesday and Thursday  pregabalin (LYRICA) 50 MG capsule Take 50 mg by mouth 2 (two) times daily.  sitaGLIPtin (JANUVIA) 50 MG tablet Take 50 mg by mouth daily.     History   Social History  . Marital Status: Widowed    Spouse Name: N/A     Number of Children: N/A  . Years of Education: 7th grade   Occupational History  . retired    Social History Main Topics  . Smoking status: Never Smoker   . Smokeless tobacco: Never Used  . Alcohol Use: No  . Drug Use: No  . Sexually Active: Not on file   History   Social History  . Marital Status: Widowed    Spouse Name: N/A    Number of Children: N/A  . Years of Education: N/A   Occupational History  . retired    Social History Main Topics  . Smoking status: Never Smoker   . Smokeless tobacco: Never Used  . Alcohol Use: No  . Drug Use: No  . Sexually Active: Not on file   Other Topics Concern  . Not on file   Social History Narrative   Lives in her home with 24 hr caregivers and her daughter is nearby.          Family Status  Relation Status Death Age  . Mother Deceased 58s    No CAD  . Father Deceased 59s    No CAD  . Brother Deceased 73s     From CAD   Family History  Problem Relation Age of Onset  . Coronary artery disease    . Diabetes       ROS: Uses a walker, no recent falls. No recent illnesses or injuries. Full 14 point review of systems complete and found to be negative unless listed above.  Physical Exam: Blood pressure 124/54, pulse 108, temperature 98.9 F (37.2 C), temperature source Oral, resp. rate 24, height 5' (1.524 m), weight 108 lb 14.5 oz (49.4 kg), SpO2 98.00%.  General: Well developed, well nourished, elderly female in no acute distress Head: Eyes PERRLA, No xanthomas.   Normocephalic and atraumatic, oropharynx without edema or exudate. Dentition: poor  Lungs: Some rales both bases, coarse in some areas with few crackles Heart: Heart irregular rate and rhythm with S1, S2, no clinically significant murmur. pulses are 2+ both upper extrem.  Slightly decreased in lower extrem Neck: No carotid bruits. No lymphadenopathy.  JVD at 8-9 cm with positive hepato jugular reflux. Abdomen: Bowel sounds present, abdomen soft and non-tender  without masses or hernias noted. Msk:  No spine or cva tenderness. No weakness, no joint deformities or effusions. Extremities: No clubbing or cyanosis. no edema.  Neuro: Alert and oriented X 3. No focal deficits noted. Psych:  Good affect, responds appropriately Skin: No rashes or lesions noted.  Labs:   Lab Results  Component Value Date   WBC 7.5 04/21/2013   HGB 11.2* 04/21/2013   HCT 33.3* 04/21/2013   MCV 98.2 04/21/2013   PLT 119* 04/21/2013  No results found for this basename: INR,  in the last 72 hours   Recent Labs Lab 04/21/13 0900  NA 140  K 3.7  CL 104  CO2 26  BUN 18  CREATININE 0.57  CALCIUM 9.8  PROT 6.6  BILITOT 0.1*  ALKPHOS 49  ALT 6  AST 12  GLUCOSE 187*   No results found for this basename: CKTOTAL, CKMB, TROPONINI,  in the last 72 hours  Recent Labs  04/21/13 0047 04/21/13 0321  TROPIPOC 0.00 0.00   Pro B Natriuretic peptide (BNP)  Date/Time Value Range Status  04/21/2013  3:29 AM 905.6* 0 - 450 pg/mL Final   TSH  Date/Time Value Range Status  04/21/2013  3:29 AM 1.324  0.350 - 4.500 uIU/mL Final   Echo: 11/14/2008 SUMMARY - There is severe hypokinesis of the anterior wall and the septum. The other walls are vigorous. There does not appear to be significant dysfunction of the apex. The tissue doppler data suggests increased filling pressure. Overall left ventricular systolic function was mildly decreased. Left ventricular ejection fraction was estimated to be 45 %. Left ventricular wall thickness was moderately increased. - Normal aortic valve - Normal mitral valve IMPRESSIONS - The wall motion abnormalities are new since the study of Feb. 2008.  ECG: 21-Apr-2013 00:43:45 afib with rapid ventricular response BORDERLINE LEFT AXIS DEVIATION ~ QRS axis (-15,-29) CONSIDER ANTEROSEPTAL INFARCT ~ Q >35mS, V1 V2 REPOL ABNRM SUGGESTS ISCHEMIA, ANT-LAT LEADS ~ ST dep, T neg, I aVL V2-V6 Vent. rate 143 BPM PR interval * ms QRS duration 68  ms QT/QTc 324/500 ms P-R-T axes * -22 140  Radiology:  Dg Chest 2 View 04/21/2013  *RADIOLOGY REPORT*  Clinical Data: 79 year old female with chest pain.  CHEST - 2 VIEW  Comparison: 02/03/2011 and earlier.  Findings: AP portable semi upright view at 0120 hours.  Mildly lower lung volumes. Stable cardiomegaly and mediastinal contours. Visualized tracheal air column is within normal limits.  No pneumothorax.  Stable pulmonary vascularity without pulmonary edema.  Chronic retrocardiac hypoventilation appears not significantly changed.  No definite effusion.  No consolidation identified.  IMPRESSION: Stable cardiomegaly.  No acute cardiopulmonary abnormality.   Original Report Authenticated By: Erskine Speed, M.D.     ASSESSMENT AND PLAN:   The patient was seen today by Dr Shirlee Latch, the patient evaluated and the data reviewed.  Principal Problem:   Chest pain Active Problems:   HYPOTHYROIDISM   DIABETES MELLITUS, TYPE II   HYPERLIPIDEMIA   ANEMIA, CHRONIC   HYPERTENSION   CAD   Thrombocytopenia   Atrial fibrillation   Signed: Theodore Demark, PA-C 04/21/2013 12:06 PM Beeper [phone]  Co-Sign MD  Patient seen with PA, agree with the above note.  1. Atrial fibrillation with RVR: New onset.  HR now controlled in the 70s-80s.  I had a long discussion with her about anticoagulation.  She is stable on her feet using her walker.  No falls for > 1 year.  She is currently on ASA 81 and Plavix.  I am going to have her stop ASA and Plavix.  She will start Eliquis 2.5 mg bid (reduced dose given age and weight).  I suspect that her bleeding risk with Eliquis will be no more than it was with ASA and Plavix combined.  She can start it tomorrow.  She can continue diltiazem CD 240 mg daily for now, but if EF is low on echo, should switch to Toprol XL.   2. CHF: Mild volume overload  on exam but not short of breath.  Start Lasix 20 mg daily (on 10 mg daily at home).  Echo today.  3. CAD: Stable.  As above,  stopping ASA and Plavix with Eliquis initiation.   Marca Ancona 04/21/2013 12:25 PM

## 2013-04-21 NOTE — Progress Notes (Addendum)
[facility] Progress Note [name] TEAM 1 - Stepdown/ICU TEAM   [name] WGN:[id number] DOB: [birth year] DOA: 04/21/2013 PCP: Ezequiel Kayser, MD  Brief narrative: 79 year old female with past medical history of CAD, hypothyroidism, diabetes, dyslipidemia and hypertension (on atenolol, Norvasc and Imdur) who presented to [facility] ED with worsening retrosternal chest pain started yesterday prior to this admission. Chest pain is 7-8/10 in intensity and radiating to left arm. Pain is constant and associated with palpitations. Patient reported pain at rest and with exertion. Pain is relieved with SL nitroglycerin. No aggravating factors. No fevers or chills. No cough. No abdominal pain, no nausea or vomiting. No reports of lightheadedness or loss of consciousness. No reports of blood in stool or urine.   Assessment/Plan: Principal Problem:   Chest pain - likely due to uncontrolled rate and now resolved - cardiac enzymes negative  Active Problems:   Atrial fibrillation with RVR - rate controlled on Cardizem infusion/ switched to PO long acting Cardizem - f/u on ECHO - appreciate cards eval- pt follows with [place] as outpt - if EF still low, will switch to B Blocker as per cardio recommendations.  - ASA and Plavix discontinued (plavix was started recently for TIA symptoms- she cant remember if she truly takes ASA as an aid fills her medication box) -Eliquis initiated     Chronic systolic heart failure(ischemic?) - Lasix increased by cardiology - f/u on ECHO    HYPOTHYROIDISM - cont levothyroxine- TSH is WNL    DIABETES MELLITUS, TYPE II - cont home dose of insulin, metformin and linagliptin and follow    Thrombocytopenia - chronic   Code Status: full code Family Communication: with daughter and son in law Disposition Plan: transfer to tele- home in AM?  Consultants: cardiology  Procedures: none  Antibiotics: none  DVT prophylaxis: Eliquis  HPI/Subjective: Pt  alert and states she feels much better today than yesterday- no chest pain or palpitations- discussed starting a blood thinner other than ASA and Plavix with pt and daughter- she is not prone to falls and does well with ambulation as long as she uses a walker.    Objective: Blood pressure 124/74, pulse 108, temperature 98.9 F (37.2 C), temperature source Oral, resp. rate 24, height 5' (1.524 m), weight 49.4 kg (108 lb 14.5 oz), SpO2 96.00%.  Intake/Output Summary (Last 24 hours) at 04/21/13 1450 Last data filed at 04/21/13 1400  Gross per 24 hour  Intake    550 ml  Output      2 ml  Net    548 ml     Exam: General: AAO x3, HOH, elderly thin female, No acute respiratory distress Lungs: Mild crackles at bases Cardiovascular: Regular rate and rhythm without murmur gallop or rub normal S1 and S2 Abdomen: Nontender, nondistended, soft, bowel sounds positive, no rebound, no ascites, no appreciable mass Extremities: No significant cyanosis, clubbing, or edema bilateral lower extremities  Data Reviewed: Basic Metabolic Panel:  Recent Labs Lab 04/21/13 0048 04/21/13 0900  NA 140 140  K 3.7 3.7  CL 105 104  CO2  --  26  GLUCOSE 151* 187*  BUN 19 18  CREATININE 0.60 0.57  CALCIUM  --  9.8   Liver Function Tests:  Recent Labs Lab 04/21/13 0900  AST 12  ALT 6  ALKPHOS 49  BILITOT 0.1*  PROT 6.6  ALBUMIN 3.2*   No results found for this basename: LIPASE, AMYLASE,  in the last 168 hours No results found for this  basename: AMMONIA,  in the last 168 hours CBC:  Recent Labs Lab 04/21/13 0037 04/21/13 0048 04/21/13 0900  WBC 9.6  --  7.5  NEUTROABS 6.1  --   --   HGB 11.8* 11.9* 11.2*  HCT 35.3* 35.0* 33.3*  MCV 98.3  --  98.2  PLT 128*  --  119*   Cardiac Enzymes: No results found for this basename: CKTOTAL, CKMB, CKMBINDEX, TROPONINI,  in the last 168 hours BNP (last 3 results)  Recent Labs  04/21/13 0329  PROBNP 905.6*   CBG: No results found for this  basename: GLUCAP,  in the last 168 hours  Recent Results (from the past 240 hour(s))  MRSA PCR SCREENING     Status: None   Collection Time    04/21/13  5:23 AM      Result Value Range Status   MRSA by PCR NEGATIVE  NEGATIVE Final   Comment:            The GeneXpert MRSA Assay (FDA     approved for NASAL specimens     only), is one component of a     comprehensive MRSA colonization     surveillance program. It is not     intended to diagnose MRSA     infection nor to guide or     monitor treatment for     MRSA infections.     Studies:  Recent x-ray studies have been reviewed in detail by the Attending Physician  Scheduled Meds:  Scheduled Meds: . [START ON 04/22/2013] apixaban  2.5 mg Oral BID  . diltiazem  240 mg Oral Daily  . furosemide  20 mg Oral Daily  . gemfibrozil  600 mg Oral Daily  . insulin glargine  14 Units Subcutaneous Daily  . levothyroxine  112 mcg Oral QAC breakfast  . linagliptin  5 mg Oral Daily  . metFORMIN  1,000 mg Oral BID WC  . potassium chloride  10 mEq Oral BID  . pregabalin  50 mg Oral BID  . sodium chloride  3 mL Intravenous Q12H   Continuous Infusions: . sodium chloride 50 mL/hr at 04/21/13 1400  . diltiazem (CARDIZEM) infusion 5 mg/hr (04/21/13 1200)    Time spent on care of this patient: 35 min   Calvert Cantor, MD [phone]  [facility] Office  [phone] Pager - Text Page per Amion as per below:  On-Call/Text Page:      [email or web address]      password [email or web address]  If 7PM-7AM, please contact night-coverage [email or web address] Password [email or web address] 04/21/2013, 2:50 PM   LOS: 0 days

## 2013-04-21 NOTE — ED Notes (Signed)
Pt brought to ED by EMS with chest pain.As per pt its more of a pressure radiating down her left arm. NitroX2 AND Asprin 324mg  given by EMS.

## 2013-04-21 NOTE — ED Provider Notes (Signed)
History     CSN: [id number]  Arrival date & time 04/21/13  0030   First MD Initiated Contact with Patient 04/21/13 0049      Chief Complaint  Patient presents with  . Chest Pain    (Consider location/radiation/quality/duration/timing/severity/associated sxs/prior treatment) Patient is a 79 y.o. female presenting with chest pain. The history is provided by the patient and a relative. No language interpreter was used.  Chest Pain Pain location:  Substernal area Pain quality comment:  Fullness Pain radiates to:  L arm Pain radiates to the back: no   Pain severity:  Moderate Onset quality:  Sudden Timing:  Constant Progression:  Resolved Chronicity:  New Context: at rest   Relieved by:  Nothing Worsened by:  Nothing tried Ineffective treatments:  None tried Associated symptoms: palpitations   Associated symptoms: no diaphoresis and no shortness of breath   Risk factors: no surgery     Past Medical History  Diagnosis Date  . Coronary atherosclerosis of unspecified type of vessel, native or graft   . Other and unspecified hyperlipidemia   . Unspecified essential hypertension   . Peripheral vascular disease, unspecified   . Unspecified deficiency anemia   . Chronic pancreatitis   . Type II or unspecified type diabetes mellitus without mention of complication, not stated as uncontrolled   . Unspecified hypothyroidism   . Osteoporosis, unspecified   . Calculus of kidney   . Palpitations     history of  . Squamous cell carcinoma in situ of skin of lower leg, right     Past Surgical History  Procedure Laterality Date  . Total hip arthroplasty    . Appendectomy    . Abdominal hysterectomy      Family History  Problem Relation Age of Onset  . Coronary artery disease    . Diabetes      History  Substance Use Topics  . Smoking status: Never Smoker   . Smokeless tobacco: Never Used  . Alcohol Use: No    OB History   Grav Para Term Preterm Abortions TAB SAB Ect  Mult Living                  Review of Systems  Constitutional: Negative for diaphoresis.  Respiratory: Negative for shortness of breath.   Cardiovascular: Positive for chest pain and palpitations.  All other systems reviewed and are negative.    Allergies  Ciprofloxacin; Codeine; Penicillins; and Sulfamethoxazole w-trimethoprim  Home Medications   Current Outpatient Rx  Name  Route  Sig  Dispense  Refill  . acetylcysteine (MUCOMYST) 20 % nebulizer solution      MUCOMYST 600MG  ORALLY FOR 3 TOTAL DOSES. ONE DOSE 11/24/12 AT 8:00PM, ONE DOSE 11/25/12 AT 8:00AM AND 8:00PM TO PROTECT KIDNEYS FROM CONTRAST DYE   30 mL   12   . ALPRAZolam (XANAX) 0.5 MG tablet   Oral   Take 0.25 mg by mouth at bedtime as needed. Patient uses .25 mg of this medication at bedtime.         Marland Kitchen amLODipine (NORVASC) 10 MG tablet   Oral   Take 5 mg by mouth daily.         Marland Kitchen aspirin EC 81 MG tablet   Oral   Take 81 mg by mouth daily.         Marland Kitchen atenolol (TENORMIN) 25 MG tablet      take 1 and 1/2 tablets by mouth twice a day   90 tablet  3   . Clopidogrel Bisulfate (PLAVIX PO)   Oral   Take 75 mg by mouth daily.          Marland Kitchen darifenacin (ENABLEX) 7.5 MG 24 hr tablet   Oral   Take 7.5 mg by mouth daily.         . furosemide (LASIX) 20 MG tablet   Oral   Take 10 mg by mouth daily.           Marland Kitchen gemfibrozil (LOPID) 600 MG tablet   Oral   Take 600 mg by mouth 2 (two) times daily before a meal.           . insulin glargine (LANTUS) 100 UNIT/ML injection   Subcutaneous   Inject 10 Units into the skin daily.          . isosorbide mononitrate (IMDUR) 30 MG 24 hr tablet      TAKE 1 TABLET BY MOUTH TWICE A DAY   60 tablet   10   . levothyroxine (SYNTHROID, LEVOTHROID) 112 MCG tablet   Oral   Take 112 mcg by mouth daily.           . metFORMIN (GLUCOPHAGE) 500 MG tablet   Oral   Take 500 mg by mouth 2 (two) times daily with a meal.           . nitroGLYCERIN  (NITROSTAT) 0.4 MG SL tablet      place 1 tablet under the tongue if needed for chest pain   25 tablet   3   . potassium chloride (MICRO-K) 10 MEQ CR capsule   Oral   Take 10 mEq by mouth 2 (two) times a week.          . pregabalin (LYRICA) 50 MG capsule   Oral   Take 50 mg by mouth 2 (two) times daily.         . sitaGLIPtin (JANUVIA) 100 MG tablet   Oral   Take 50 mg by mouth daily. Patient uses 50 mg of this medication.         . traMADol (ULTRAM) 50 MG tablet   Oral   Take 1 tablet (50 mg total) by mouth every 6 (six) hours as needed for pain.   20 tablet   0     BP 124/66  Pulse 133  Temp(Src) 97.2 F (36.2 C) (Oral)  Resp 18  SpO2 97%  Physical Exam  Constitutional: She is oriented to person, place, and time. She appears well-developed and well-nourished.  HENT:  Head: Normocephalic and atraumatic.  Mouth/Throat: Oropharynx is clear and moist.  Eyes: Conjunctivae are normal. Pupils are equal, round, and reactive to light.  Neck: Normal range of motion. Neck supple.  Cardiovascular: An irregularly irregular rhythm present. Tachycardia present.   Pulmonary/Chest: Effort normal and breath sounds normal.  Abdominal: Soft. Bowel sounds are normal. There is no tenderness. There is no rebound and no guarding.  Musculoskeletal: Normal range of motion.  Neurological: She is alert and oriented to person, place, and time.  Skin: Skin is warm and dry. She is not diaphoretic.  Psychiatric: She has a normal mood and affect.    ED Course  Procedures (including critical care time)  Labs Reviewed  CBC WITH DIFFERENTIAL - Abnormal; Notable for the following:    RBC 3.59 (*)    Hemoglobin 11.8 (*)    HCT 35.3 (*)    Platelets 128 (*)    All other components within normal limits  POCT I-STAT, CHEM 8 - Abnormal; Notable for the following:    Glucose, Bld 151 (*)    Calcium, Ion 1.10 (*)    Hemoglobin 11.9 (*)    HCT 35.0 (*)    All other components within normal  limits  POCT I-STAT TROPONIN I   No results found.   No diagnosis found.    MDM   Date: 04/21/2013  Rate: 143  Rhythm: atrial fibrillation  QRS Axis: normal  Intervals: QT prolonged  ST/T Wave abnormalities: ST depressions anteriorly and ST depressions laterally  Conduction Disutrbances:none  Narrative Interpretation:   Old EKG Reviewed: none available   Medications  diltiazem (CARDIZEM) 100 mg in dextrose 5 % 100 mL infusion (10 mg/hr Intravenous Rate/Dose Verify 04/21/13 0600)  clopidogrel (PLAVIX) tablet 75 mg (not administered)  metFORMIN (GLUCOPHAGE) tablet 1,000 mg (not administered)  nitroGLYCERIN (NITROSTAT) SL tablet 0.4 mg (not administered)  linagliptin (TRADJENTA) tablet 5 mg (not administered)  aspirin EC tablet 81 mg (not administered)  pregabalin (LYRICA) capsule 50 mg (not administered)  gemfibrozil (LOPID) tablet 600 mg (not administered)  insulin glargine (LANTUS) injection 14 Units (not administered)  levothyroxine (SYNTHROID, LEVOTHROID) tablet 112 mcg (not administered)  potassium chloride (K-DUR,KLOR-CON) CR tablet 10 mEq (not administered)  sodium chloride 0.9 % injection 3 mL (not administered)  0.9 %  sodium chloride infusion (1,000 mLs Intravenous New Bag/Given 04/21/13 0601)  acetaminophen (TYLENOL) tablet 650 mg (not administered)    Or  acetaminophen (TYLENOL) suppository 650 mg (not administered)  HYDROcodone-acetaminophen (NORCO/VICODIN) 5-325 MG per tablet 1-2 tablet (not administered)  morphine 2 MG/ML injection 1 mg (not administered)  ondansetron (ZOFRAN) tablet 4 mg (not administered)    Or  ondansetron (ZOFRAN) injection 4 mg (not administered)  albuterol (PROVENTIL) (5 MG/ML) 0.5% nebulizer solution 2.5 mg (not administered)  diltiazem (CARDIZEM) injection 5 mg (0 mg Intravenous Stopped 04/21/13 0230)  MDM Reviewed: nursing note and vitals Interpretation: labs, ECG and x-ray Total time providing critical care: 30-74 minutes. This  excludes time spent performing separately reportable procedures and services. Consults: admitting MD  CRITICAL CARE Performed by: Jasmine Awe Total critical care time: 30 minutes Critical care time was exclusive of separately billable procedures and treating other patients. Critical care was necessary to treat or prevent imminent or life-threatening deterioration. Critical care was time spent personally by me on the following activities: development of treatment plan with patient and/or surrogate as well as nursing, discussions with consultants, evaluation of patient's response to treatment, examination of patient, obtaining history from patient or surrogate, ordering and performing treatments and interventions, ordering and review of laboratory studies, ordering and review of radiographic studies, pulse oximetry and re-evaluation of patient's condition.      Results for orders placed during the hospital encounter of 04/21/13  CBC WITH DIFFERENTIAL      Result Value Range   WBC 9.6  4.0 - 10.5 K/uL   RBC 3.59 (*) 3.87 - 5.11 MIL/uL   Hemoglobin 11.8 (*) 12.0 - 15.0 g/dL   HCT 16.1 (*) 09.6 - 04.5 %   MCV 98.3  78.0 - 100.0 fL   MCH 32.9  26.0 - 34.0 pg   MCHC 33.4  30.0 - 36.0 g/dL   RDW 40.9  81.1 - 91.4 %   Platelets 128 (*) 150 - 400 K/uL   Neutrophils Relative 64  43 - 77 %   Neutro Abs 6.1  1.7 - 7.7 K/uL   Lymphocytes Relative 25  12 - 46 %  Lymphs Abs 2.4  0.7 - 4.0 K/uL   Monocytes Relative 8  3 - 12 %   Monocytes Absolute 0.7  0.1 - 1.0 K/uL   Eosinophils Relative 2  0 - 5 %   Eosinophils Absolute 0.2  0.0 - 0.7 K/uL   Basophils Relative 1  0 - 1 %   Basophils Absolute 0.1  0.0 - 0.1 K/uL  PRO B NATRIURETIC PEPTIDE      Result Value Range   Pro B Natriuretic peptide (BNP) 905.6 (*) 0 - 450 pg/mL  POCT I-STAT TROPONIN I      Result Value Range   Troponin i, poc 0.00  0.00 - 0.08 ng/mL   Comment 3           POCT I-STAT, CHEM 8      Result Value Range    Sodium 140  135 - 145 mEq/L   Potassium 3.7  3.5 - 5.1 mEq/L   Chloride 105  96 - 112 mEq/L   BUN 19  6 - 23 mg/dL   Creatinine, Ser 1.61  0.50 - 1.10 mg/dL   Glucose, Bld 096 (*) 70 - 99 mg/dL   Calcium, Ion 0.45 (*) 1.13 - 1.30 mmol/L   TCO2 23  0 - 100 mmol/L   Hemoglobin 11.9 (*) 12.0 - 15.0 g/dL   HCT 40.9 (*) 81.1 - 91.4 %  POCT I-STAT TROPONIN I      Result Value Range   Troponin i, poc 0.00  0.00 - 0.08 ng/mL   Comment 3            Dg Chest 2 View  04/21/2013  *RADIOLOGY REPORT*  Clinical Data: 79 year old female with chest pain.  CHEST - 2 VIEW  Comparison: 02/03/2011 and earlier.  Findings: AP portable semi upright view at 0120 hours.  Mildly lower lung volumes. Stable cardiomegaly and mediastinal contours. Visualized tracheal air column is within normal limits.  No pneumothorax.  Stable pulmonary vascularity without pulmonary edema.  Chronic retrocardiac hypoventilation appears not significantly changed.  No definite effusion.  No consolidation identified.  IMPRESSION: Stable cardiomegaly.  No acute cardiopulmonary abnormality.   Original Report Authenticated By: Erskine Speed, M.D.       Jasmine Awe, MD 04/21/13 [phone]

## 2013-04-21 NOTE — ED Notes (Signed)
Patient up to bedside commode.  Patient tolerated well.

## 2013-04-22 DIAGNOSIS — I2510 Atherosclerotic heart disease of native coronary artery without angina pectoris: Secondary | ICD-10-CM

## 2013-04-22 DIAGNOSIS — D539 Nutritional anemia, unspecified: Secondary | ICD-10-CM

## 2013-04-22 DIAGNOSIS — D696 Thrombocytopenia, unspecified: Secondary | ICD-10-CM

## 2013-04-22 DIAGNOSIS — I5022 Chronic systolic (congestive) heart failure: Secondary | ICD-10-CM

## 2013-04-22 LAB — GLUCOSE, CAPILLARY
Glucose-Capillary: 91 mg/dL (ref 70–99)
Glucose-Capillary: 121 mg/dL — ABNORMAL HIGH (ref 70–99)

## 2013-04-22 LAB — BASIC METABOLIC PANEL
Calcium: 8.8 mg/dL (ref 8.4–10.5)
Creatinine, Ser: 0.52 mg/dL (ref 0.50–1.10)
GFR calc Af Amer: 89 mL/min — ABNORMAL LOW (ref >90)
GFR calc non Af Amer: 77 mL/min — ABNORMAL LOW (ref >90)

## 2013-04-22 LAB — IRON AND TIBC
Iron: 83 ug/dL (ref 42–135)
Saturation Ratios: 28 % (ref 20–55)

## 2013-04-22 LAB — FERRITIN: Ferritin: 18 ng/mL (ref 10–291)

## 2013-04-22 MED ORDER — METOPROLOL TARTRATE 25 MG PO TABS
25.0000 mg | ORAL_TABLET | Freq: Two times a day (BID) | ORAL | Status: DC
  Administered 2013-04-22 – 2013-04-25 (×6): 25 mg via ORAL
  Filled 2013-04-22 (×8): qty 1

## 2013-04-22 MED ORDER — DILTIAZEM HCL ER COATED BEADS 300 MG PO CP24
300.0000 mg | ORAL_CAPSULE | Freq: Every day | ORAL | Status: DC
  Administered 2013-04-22 – 2013-04-23 (×2): 300 mg via ORAL
  Filled 2013-04-22 (×3): qty 1

## 2013-04-22 NOTE — Progress Notes (Addendum)
[name]  79 y.o.  female  Subjective: Patient reports resolution of palpitations and dyspnea. No chest tightness. Eager to be discharged.  Allergy: Ciprofloxacin; Codeine; Penicillins; and Sulfamethoxazole w-trimethoprim  Objective: Vital signs in last 24 hours: Temp:  [97.4 F (36.3 C)-98.9 F (37.2 C)] 97.4 F (36.3 C) (05/10 0548) Pulse Rate:  [85-127] 85 (05/10 0548) Resp:  [18] 18 (05/10 0548) BP: (107-134)/(54-76) 107/67 mmHg (05/10 0548) SpO2:  [96 %-97 %] 97 % (05/10 0548) Weight:  [45.5 kg (100 lb 5 oz)-47.1 kg (103 lb 13.4 oz)] 45.5 kg (100 lb 5 oz) (05/10 0548)  45.5 kg (100 lb 5 oz) Body mass index is 19.59 kg/(m^2).  Weight change: -2.3 kg (-5 lb 1.1 oz) Last BM Date: 04/21/13  Intake/Output from previous day: 05/09 0701 - 05/10 0700 In: 1440 [P.O.:840; I.V.:600] Out: 2346 [Urine:2342; Stool:4] Total I/O since admission:  -800 cc  General- Well developed; no acute distress; quite thin Neck- No JVD, no carotid bruits Lungs-  bibasilar rales with coarse breath sounds at the bases ; normal I:E ratio; mild kyphosis  Cardiovascular- normal PMI; normal S1 and S2; rapid irregular rhythm; modest systolic murmur at the left sternal border  Abdomen- normal bowel sounds; soft and non-tender without masses or organomegaly Skin- Warm, no significant lesions Extremities- Nl distal pulses;  1/2+ edema  Lab Results: CBC:   Recent Labs  04/21/13 0037 04/21/13 0048 04/21/13 0900  WBC 9.6  --  7.5  HGB 11.8* 11.9* 11.2*  HCT 35.3* 35.0* 33.3*  PLT 128*  --  119*   BMET:  Recent Labs  04/21/13 0900 04/22/13 0751  NA 140 140  K 3.7 3.7  CL 104 105  CO2 26 26  GLUCOSE 187* 120*  BUN 18 14  CREATININE 0.57 0.52  CALCIUM 9.8 8.8   Hepatic Function:   Recent Labs  04/21/13 0900  PROT 6.6  ALBUMIN 3.2*  AST 12  ALT 6  ALKPHOS 49  BILITOT 0.1*   EKG: Tracing of 04/21/2013: AF with a rapid ventricular response; inferolateral ST segment depression;  delayed R-wave progression; no previous tracing for comparison.  Telemetry: Atrial fibrillation, adequate rate control at night, but increased post awakening, infrequent PVCs versus aberrantly, no significant pauses.  Echocardiogram 04/21/2013: Mild biatrial enlargement; moderate LVH; EF-60%; no significant valvular abnormalities.  Imaging Studies/Results: Dg Chest 04/21/2013  Stable cardiomegaly.  No acute cardiopulmonary abnormality.     Imaging: Imaging results have been reviewed  Medications:  I have reviewed the patient's current medications. Scheduled: . apixaban  2.5 mg Oral BID  . diltiazem  300 mg Oral Daily  . furosemide  20 mg Oral Daily  . gemfibrozil  600 mg Oral Daily  . insulin glargine  14 Units Subcutaneous Daily  . levothyroxine  112 mcg Oral QAC breakfast  . linagliptin  5 mg Oral Daily  . metFORMIN  1,000 mg Oral BID WC  . potassium chloride  10 mEq Oral BID  . pregabalin  50 mg Oral BID  . sodium chloride  3 mL Intravenous Q12H   Assessment/Plan: Atrial fibrillation:  Heart rate control remains inadequate on diltiazem alone. Blood pressures on the low side, but not frankly hypotensive. Metoprolol will be added for better rate control. Digoxin may be necessary if blood pressure decreases excessively with beta blocker.  H/o CHF with nl EF: Discontinue IV fluids.  Thrombocytopenia: 80-160,000 with clumping reported virtually every time CBC is obtained. No bleeding history. Probably does not represent significant pathology.  Normocytic anemia: Present for least the past 4 years; repeat iron studies and stool Hemoccult testing will be performed  Diabetes mellitus:  CBGs of 91-159. Control appears to be good with current therapy, which will be continued.   LOS: 1 day   [name] 04/22/2013, 9:58 AM

## 2013-04-22 NOTE — Progress Notes (Signed)
[facility] PROGRESS NOTE  [name] JYN:[id number] DOB: 1913-03-26 DOA: 04/21/2013 PCP: Ezequiel Kayser, MD  Brief narrative:  79 year old female with past medical history of CAD, hypothyroidism, diabetes, dyslipidemia and hypertension  who presented to [facility] ED with worsening retrosternal chest pain 1 day prior to admission. Chest pain is 7-8/10 in intensity and radiating to left arm. Patient found to have A fib with RVR and admitted stepdown for closer monitoring.  Assessment/Plan:   Chest pain  - likely due to rapid afib. Resolved once HR controlled.  - cardiac enzymes negative    Atrial fibrillation with RVR  - rate controlled on Cardizem infusion/ switched to oral  long acting Cardizem 240 mg daily. HR elevated up to 120 on the monitor. Will increase dose to 300 mg daily. - 2D echo with normal EF - appreciate cardiology eval.pt follows with [place] as outpt  - ASA and Plavix discontinued (plavix was started recently for TIA symptoms -started on low dose eliquis  Chronic systolic heart failure(ischemic?)  - Lasix increased by cardiology    HYPOTHYROIDISM  - cont levothyroxine - TSH normal  DIABETES MELLITUS, TYPE II  - cont home dose of insulin, metformin and linagliptin   Thrombocytopenia  - chronic    Code status: full   Dispo: home tomorrow if HR controlled    Consultants:  lebeuar cardiology  Procedures:  none  Antibiotics:  NONE  HPI/Subjective: Denies any chest pain symptoms.HR still elevated up to 120 on the monitor  Objective: Filed Vitals:   04/21/13 1859 04/21/13 2027 04/22/13 0123 04/22/13 0548  BP: 134/75 132/75 126/76 107/67  Pulse: 98 121 127 85  Temp:  97.9 F (36.6 C) 97.5 F (36.4 C) 97.4 F (36.3 C)  TempSrc:  Oral Oral Oral  Resp:  18 18 18   Height: 5' (1.524 m)     Weight: 47.1 kg (103 lb 13.4 oz)   45.5 kg (100 lb 5 oz)  SpO2: 96% 96% 96% 97%    Intake/Output Summary (Last 24 hours) at 04/22/13 0956 Last data  filed at 04/22/13 0640  Gross per 24 hour  Intake   1320 ml  Output   2346 ml  Net  -1026 ml   Filed Weights   04/21/13 0435 04/21/13 1859 04/22/13 0548  Weight: 49.4 kg (108 lb 14.5 oz) 47.1 kg (103 lb 13.4 oz) 45.5 kg (100 lb 5 oz)    Exam:   General:  Elderly thin built female in NAD  Cardiovascular: S1&S2 irregular, no murmurs, rubs or gallop  Respiratory:clear b/l, no added sounds  Abdomen: soft, NT, ND, BS+  Musculoskeletal: warm, no edema  CNS: AAOX3  Data Reviewed: Basic Metabolic Panel:  Recent Labs Lab 04/21/13 0048 04/21/13 0900 04/22/13 0751  NA 140 140 140  K 3.7 3.7 3.7  CL 105 104 105  CO2  --  26 26  GLUCOSE 151* 187* 120*  BUN 19 18 14   CREATININE 0.60 0.57 0.52  CALCIUM  --  9.8 8.8   Liver Function Tests:  Recent Labs Lab 04/21/13 0900  AST 12  ALT 6  ALKPHOS 49  BILITOT 0.1*  PROT 6.6  ALBUMIN 3.2*   No results found for this basename: LIPASE, AMYLASE,  in the last 168 hours No results found for this basename: AMMONIA,  in the last 168 hours CBC:  Recent Labs Lab 04/21/13 0037 04/21/13 0048 04/21/13 0900  WBC 9.6  --  7.5  NEUTROABS 6.1  --   --  HGB 11.8* 11.9* 11.2*  HCT 35.3* 35.0* 33.3*  MCV 98.3  --  98.2  PLT 128*  --  119*   Cardiac Enzymes: No results found for this basename: CKTOTAL, CKMB, CKMBINDEX, TROPONINI,  in the last 168 hours BNP (last 3 results)  Recent Labs  04/21/13 0329  PROBNP 905.6*   CBG:  Recent Labs Lab 04/21/13 0816 04/21/13 2120 04/22/13 0632  GLUCAP 141* 159* 91    Recent Results (from the past 240 hour(s))  MRSA PCR SCREENING     Status: None   Collection Time    04/21/13  5:23 AM      Result Value Range Status   MRSA by PCR NEGATIVE  NEGATIVE Final   Comment:            The GeneXpert MRSA Assay (FDA     approved for NASAL specimens     only), is one component of a     comprehensive MRSA colonization     surveillance program. It is not     intended to diagnose MRSA      infection nor to guide or     monitor treatment for     MRSA infections.     Studies: Dg Chest 2 View  04/21/2013  *RADIOLOGY REPORT*  Clinical Data: 79 year old female with chest pain.  CHEST - 2 VIEW  Comparison: 02/03/2011 and earlier.  Findings: AP portable semi upright view at 0120 hours.  Mildly lower lung volumes. Stable cardiomegaly and mediastinal contours. Visualized tracheal air column is within normal limits.  No pneumothorax.  Stable pulmonary vascularity without pulmonary edema.  Chronic retrocardiac hypoventilation appears not significantly changed.  No definite effusion.  No consolidation identified.  IMPRESSION: Stable cardiomegaly.  No acute cardiopulmonary abnormality.   Original Report Authenticated By: Erskine Speed, M.D.     Scheduled Meds: . apixaban  2.5 mg Oral BID  . diltiazem  300 mg Oral Daily  . furosemide  20 mg Oral Daily  . gemfibrozil  600 mg Oral Daily  . insulin glargine  14 Units Subcutaneous Daily  . levothyroxine  112 mcg Oral QAC breakfast  . linagliptin  5 mg Oral Daily  . metFORMIN  1,000 mg Oral BID WC  . potassium chloride  10 mEq Oral BID  . pregabalin  50 mg Oral BID  . sodium chloride  3 mL Intravenous Q12H   Continuous Infusions: . sodium chloride 50 mL/hr at 04/21/13 2156  . diltiazem (CARDIZEM) infusion 5 mg/hr (04/21/13 1200)      Time spent: 25 minutes    Jarell Mcewen  [facility] Pager [phone] If 7PM-7AM, please contact night-coverage at [email or web address], password [facility] 04/22/2013, 9:56 AM  LOS: 1 day

## 2013-04-23 DIAGNOSIS — I2510 Atherosclerotic heart disease of native coronary artery without angina pectoris: Secondary | ICD-10-CM

## 2013-04-23 DIAGNOSIS — I4891 Unspecified atrial fibrillation: Secondary | ICD-10-CM

## 2013-04-23 DIAGNOSIS — R079 Chest pain, unspecified: Secondary | ICD-10-CM

## 2013-04-23 DIAGNOSIS — I10 Essential (primary) hypertension: Secondary | ICD-10-CM

## 2013-04-23 LAB — GLUCOSE, CAPILLARY
Glucose-Capillary: 243 mg/dL — ABNORMAL HIGH (ref 70–99)
Glucose-Capillary: 255 mg/dL — ABNORMAL HIGH (ref 70–99)

## 2013-04-23 MED ORDER — DIGOXIN 250 MCG PO TABS
0.2500 mg | ORAL_TABLET | Freq: Once | ORAL | Status: AC
  Administered 2013-04-23: 0.25 mg via ORAL
  Filled 2013-04-23: qty 1

## 2013-04-23 MED ORDER — DIGOXIN 0.25 MG/ML IJ SOLN
0.2500 mg | Freq: Once | INTRAMUSCULAR | Status: AC
  Filled 2013-04-23: qty 1

## 2013-04-23 MED ORDER — DIGOXIN 250 MCG PO TABS
0.2500 mg | ORAL_TABLET | Freq: Every day | ORAL | Status: DC
  Administered 2013-04-23: 0.25 mg via ORAL
  Filled 2013-04-23: qty 1

## 2013-04-23 MED ORDER — DIGOXIN 125 MCG PO TABS
0.1850 mg | ORAL_TABLET | Freq: Every day | ORAL | Status: DC
  Filled 2013-04-23: qty 1

## 2013-04-23 MED ORDER — DIGOXIN 0.25 MG/ML IJ SOLN
0.5000 mg | Freq: Once | INTRAMUSCULAR | Status: AC
  Administered 2013-04-23: 0.5 mg via INTRAVENOUS
  Filled 2013-04-23 (×2): qty 2

## 2013-04-23 MED ORDER — DIGOXIN 0.25 MG/ML IJ SOLN: 0.2500 mg | Freq: Once | INTRAMUSCULAR | Status: DC

## 2013-04-23 MED ORDER — DIGOXIN 250 MCG PO TABS
0.2500 mg | ORAL_TABLET | Freq: Once | ORAL | Status: DC
Start: 2013-04-23 — End: 2013-04-23

## 2013-04-23 NOTE — Progress Notes (Signed)
[facility] PROGRESS NOTE  [name] ZOX:[id number] DOB: [birth year] DOA: 04/21/2013 PCP: Ezequiel Kayser, MD  Brief narrative:  79 year old female with past medical history of CAD, hypothyroidism, diabetes, dyslipidemia and hypertension who presented to [facility] ED with worsening retrosternal chest pain 1 day prior to admission. Chest pain is 7-8/10 in intensity and radiating to left arm. Patient found to have A fib with RVR and admitted stepdown for closer monitoring.   Assessment/Plan:  Chest pain  - likely due to rapid afib. Resolved once HR controlled.  - cardiac enzymes negative.   Atrial fibrillation with RVR  -placed  Cardizem drip  and  switched to oral long acting Cardizem 240 mg daily. HR elevated up to 120 on the monitor. Will increased dose to 300 mg daily and added BB. unable to increase dose due to soft BP. HR still elevated to 130s. Loaded with IV digoxin this am. HR better now. Will place on daily digoxin. - 2D echo with normal EF  - appreciate cardiology eval.pt follows with [place] as outpt  - ASA and Plavix discontinued (plavix was started recently for TIA symptoms) -started on low dose eliquis.   Chronic systolic heart failure(ischemic?)  - Lasix dose  increased by cardiology   HYPOTHYROIDISM  - cont levothyroxine  - TSH normal   DIABETES MELLITUS, TYPE II  - cont home dose of insulin, metformin and linagliptin   Thrombocytopenia  - chronic   Code status: full  Dispo: home once HR controlled   Consultants:  lebeuar cardiology  Procedures:  None  Antibiotics:  NONE HPI/Subjective:  Denies any chest pain symptoms.HR still elevated up to 120 on the monitor   HPI/Subjective: Patient denies any symptoms. HR remains high upto 130s. Loaded with IV digoxin 0.5 mg this am.  Objective: Filed Vitals:   04/22/13 1300 04/22/13 2005 04/23/13 0548 04/23/13 1010  BP: 90/75 142/58 109/46 110/60  Pulse: 138 99  122  Temp: 97.7 F (36.5 C) 97.3 F (36.3  C) 97.7 F (36.5 C)   TempSrc: Oral Oral Oral   Resp: 18 18 18    Height:      Weight:   44.6 kg (98 lb 5.2 oz)   SpO2: 100% 100% 99%     Intake/Output Summary (Last 24 hours) at 04/23/13 1535 Last data filed at 04/22/13 2335  Gross per 24 hour  Intake    240 ml  Output    700 ml  Net   -460 ml   Filed Weights   04/21/13 1859 04/22/13 0548 04/23/13 0548  Weight: 47.1 kg (103 lb 13.4 oz) 45.5 kg (100 lb 5 oz) 44.6 kg (98 lb 5.2 oz)    Exam:  General: Elderly thin built female in NAD  Cardiovascular: S1&S2 irregular, no murmurs, rubs or gallop  Respiratory:clear b/l, no added sounds  Abdomen: soft, NT, ND, BS+  Musculoskeletal: warm, no edema  CNS: AAOX3   Data Reviewed: Basic Metabolic Panel:  Recent Labs Lab 04/21/13 0048 04/21/13 0900 04/22/13 0751  NA 140 140 140  K 3.7 3.7 3.7  CL 105 104 105  CO2  --  26 26  GLUCOSE 151* 187* 120*  BUN 19 18 14   CREATININE 0.60 0.57 0.52  CALCIUM  --  9.8 8.8   Liver Function Tests:  Recent Labs Lab 04/21/13 0900  AST 12  ALT 6  ALKPHOS 49  BILITOT 0.1*  PROT 6.6  ALBUMIN 3.2*   No results found for this basename: LIPASE, AMYLASE,  in  the last 168 hours No results found for this basename: AMMONIA,  in the last 168 hours CBC:  Recent Labs Lab 04/21/13 0037 04/21/13 0048 04/21/13 0900  WBC 9.6  --  7.5  NEUTROABS 6.1  --   --   HGB 11.8* 11.9* 11.2*  HCT 35.3* 35.0* 33.3*  MCV 98.3  --  98.2  PLT 128*  --  119*   Cardiac Enzymes: No results found for this basename: CKTOTAL, CKMB, CKMBINDEX, TROPONINI,  in the last 168 hours BNP (last 3 results)  Recent Labs  04/21/13 0329  PROBNP 905.6*   CBG:  Recent Labs Lab 04/21/13 2120 04/22/13 0632 04/22/13 1150 04/22/13 1702 04/22/13 2126  GLUCAP 159* 91 143* 116* 121*    Recent Results (from the past 240 hour(s))  MRSA PCR SCREENING     Status: None   Collection Time    04/21/13  5:23 AM      Result Value Range Status   MRSA by PCR  NEGATIVE  NEGATIVE Final   Comment:            The GeneXpert MRSA Assay (FDA     approved for NASAL specimens     only), is one component of a     comprehensive MRSA colonization     surveillance program. It is not     intended to diagnose MRSA     infection nor to guide or     monitor treatment for     MRSA infections.     Studies: No results found.  Scheduled Meds: . apixaban  2.5 mg Oral BID  . diltiazem  300 mg Oral Daily  . furosemide  20 mg Oral Daily  . gemfibrozil  600 mg Oral Daily  . insulin glargine  14 Units Subcutaneous Daily  . levothyroxine  112 mcg Oral QAC breakfast  . linagliptin  5 mg Oral Daily  . metFORMIN  1,000 mg Oral BID WC  . metoprolol tartrate  25 mg Oral BID  . potassium chloride  10 mEq Oral BID  . pregabalin  50 mg Oral BID  . sodium chloride  3 mL Intravenous Q12H   Continuous Infusions:     Time spent: 25 minutes    Shontez Sermon  [facility] Pager [phone]. If 7PM-7AM, please contact night-coverage at [email or web address], password [facility] 04/23/2013, 3:35 PM  LOS: 2 days

## 2013-04-23 NOTE — Progress Notes (Addendum)
[name]  79 y.o.  female  Subjective: Denies dyspnea, chest pain or palpitations; minimally active in room with assistance  Allergy: Ciprofloxacin; Codeine; Penicillins; and Sulfamethoxazole w-trimethoprim  Objective: Vital signs in last 24 hours: Temp:  [97.3 F (36.3 C)-97.7 F (36.5 C)] 97.7 F (36.5 C) (05/11 0548) Pulse Rate:  [34-138] 99 (05/10 2005) Resp:  [18] 18 (05/11 0548) BP: (90-142)/(46-75) 109/46 mmHg (05/11 0548) SpO2:  [99 %-100 %] 99 % (05/11 0548) Weight:  [44.6 kg (98 lb 5.2 oz)] 44.6 kg (98 lb 5.2 oz) (05/11 0548)  44.6 kg (98 lb 5.2 oz) Body mass index is 19.2 kg/(m^2).  Weight change: -2.5 kg (-5 lb 8.2 oz) Last BM Date: 04/22/13  Intake/Output from previous day: 05/10 0701 - 05/11 0700 In: 780 [P.O.:480; I.V.:300] Out: 800 [Urine:800] Total I/O since admission:  -860 cc  General- Well developed; no acute distress; quite thin Neck- No JVD, no carotid bruits Lungs- few  bibasilar rales; normal I:E ratio; mild kyphosis  Cardiovascular- normal PMI; normal S1 and S2; rapid irregular rhythm; modest systolic murmur at the left sternal border  Abdomen- normal bowel sounds; soft and non-tender without masses or organomegaly Skin- Warm, no significant lesions Extremities- Nl distal pulses;  1/2+ edema  Lab Results: CBC:    Recent Labs  04/21/13 0037 04/21/13 0048 04/21/13 0900  WBC 9.6  --  7.5  HGB 11.8* 11.9* 11.2*  HCT 35.3* 35.0* 33.3*  PLT 128*  --  119*   BMET:   Recent Labs  04/21/13 0900 04/22/13 0751  NA 140 140  K 3.7 3.7  CL 104 105  CO2 26 26  GLUCOSE 187* 120*  BUN 18 14  CREATININE 0.57 0.52  CALCIUM 9.8 8.8   Hepatic Function:    Recent Labs  04/21/13 0900  PROT 6.6  ALBUMIN 3.2*  AST 12  ALT 6  ALKPHOS 49  BILITOT 0.1*   EKG: Tracing of 04/21/2013: AF with a rapid ventricular response; inferolateral ST segment depression; delayed R-wave progression; no previous tracing for comparison.  Telemetry: Atrial  fibrillation, adequate rate control at night, but increased post awakening, infrequent PVCs   Echocardiogram 04/21/2013: Mild biatrial enlargement; moderate LVH; EF-60%; no significant valvular abnormalities.  Imaging Studies/Results: Dg Chest 04/21/2013  Stable cardiomegaly.  No acute cardiopulmonary abnormality.     Imaging: Imaging results have been reviewed  Medications:  I have reviewed the patient's current medications. Scheduled: . apixaban  2.5 mg Oral BID  . digoxin  0.5 mg Intravenous Once  . diltiazem  300 mg Oral Daily  . furosemide  20 mg Oral Daily  . gemfibrozil  600 mg Oral Daily  . insulin glargine  14 Units Subcutaneous Daily  . levothyroxine  112 mcg Oral QAC breakfast  . linagliptin  5 mg Oral Daily  . metFORMIN  1,000 mg Oral BID WC  . metoprolol tartrate  25 mg Oral BID  . potassium chloride  10 mEq Oral BID  . pregabalin  50 mg Oral BID  . sodium chloride  3 mL Intravenous Q12H   Assessment/Plan: Atrial fibrillation:  Blood pressure did not tolerate initial doses of metoprolol; patient be loaded with intravenous digoxin today. If ineffective, amiodarone will be the next rate control agent to utilize.  Due to mild hypothyroidism, patient may  Have increased sensitivity to digoxin.  Loading dose-1 mg over 12 hours.  Initial maintenance:0.1875 mg/day.  H/o CHF with nl EF: I&O remained balanced without clinical evidence for congestive heart failure.  Normocytic anemia: Fe studies- iron-83, TIBC-294, 28%, ferritin-18; surgeon remains on the low side of normal without convincing evidence for iron deficiency. Stool Hemoccults pending  Diabetes mellitus:  CBGs of 91-143. Excellent control of diabetes.  LOS: 2 days   [place] Bing 04/23/2013, 8:40 AM

## 2013-04-23 NOTE — Progress Notes (Signed)
MEDICATION RELATED NOTE   Pharmacy Re:  Digoxin Indication: Afib -  Rate control  Patient Measurements: Height: 5' (152.4 cm) Weight: 98 lb 5.2 oz (44.6 kg) (scale C) IBW/kg (Calculated) : 45.5  Labs:  Recent Labs  04/21/13 0037 04/21/13 0048 04/21/13 0900 04/22/13 0751  CREATININE  --  0.60 0.57 0.52   Estimated Creatinine Clearance: 27.6 ml/min (by C-G formula based on Cr of 0.52).   Assessment: This is an elderly 79 yo female with Afib and RVR.  She has been loaded with IV Digoxin and placed on an oral daily regimen of 0.1875 mg .  Her creatinine is 0.52 but her estimated clearance is ~ 28 ml/min due to her lean body mass of 44.6 kg.  Using population kinetics, her estimated digoxin clearance with this degree of renal function is  ~ 19.5%.  Her calculated maintenance dose is ~ 0.125 mg - 0.183 mg daily.    Goal of Therapy:  Digoxin level 0.8-1.2 mcg/ml  Recommendation:  Consider checking a digoxin level after 3 - 4 days to ensure appropriate clearance.  Nadara Mustard, PharmD., MS Clinical Pharmacist Pager:  [phone] Thank you for allowing pharmacy to be part of this patients care team. 04/23/2013,6:23 PM

## 2013-04-24 DIAGNOSIS — I4891 Unspecified atrial fibrillation: Secondary | ICD-10-CM | POA: Diagnosis present

## 2013-04-24 LAB — BASIC METABOLIC PANEL
CO2: 24 mEq/L (ref 19–32)
Chloride: 100 mEq/L (ref 96–112)
Sodium: 137 mEq/L (ref 135–145)

## 2013-04-24 LAB — GLUCOSE, CAPILLARY
Glucose-Capillary: 198 mg/dL — ABNORMAL HIGH (ref 70–99)
Glucose-Capillary: 149 mg/dL — ABNORMAL HIGH (ref 70–99)
Glucose-Capillary: 172 mg/dL — ABNORMAL HIGH (ref 70–99)

## 2013-04-24 MED ORDER — DILTIAZEM HCL 30 MG PO TABS: 30.0000 mg | ORAL_TABLET | Freq: Two times a day (BID) | ORAL | Status: DC | PRN

## 2013-04-24 MED ORDER — DILTIAZEM HCL ER COATED BEADS 360 MG PO CP24: 360.0000 mg | ORAL_CAPSULE | Freq: Every day | ORAL | Status: DC

## 2013-04-24 MED ORDER — FUROSEMIDE 20 MG PO TABS: 20.0000 mg | ORAL_TABLET | Freq: Every day | ORAL | Status: DC

## 2013-04-24 MED ORDER — METOPROLOL TARTRATE 25 MG PO TABS: 25.0000 mg | ORAL_TABLET | Freq: Two times a day (BID) | ORAL | Status: DC

## 2013-04-24 MED ORDER — AMIODARONE HCL 200 MG PO TABS
200.0000 mg | ORAL_TABLET | Freq: Every day | ORAL | Status: DC
  Administered 2013-04-24 – 2013-04-25 (×2): 200 mg via ORAL
  Filled 2013-04-24 (×2): qty 1

## 2013-04-24 MED ORDER — DILTIAZEM HCL ER COATED BEADS 360 MG PO CP24
360.0000 mg | ORAL_CAPSULE | Freq: Every day | ORAL | Status: DC
  Administered 2013-04-24 – 2013-04-25 (×2): 360 mg via ORAL
  Filled 2013-04-24 (×2): qty 1

## 2013-04-24 MED ORDER — DILTIAZEM HCL 30 MG PO TABS
30.0000 mg | ORAL_TABLET | Freq: Once | ORAL | Status: AC
  Administered 2013-04-24: 30 mg via ORAL
  Filled 2013-04-24: qty 1

## 2013-04-24 MED ORDER — DIGOXIN 125 MCG PO TABS
0.1250 mg | ORAL_TABLET | Freq: Every day | ORAL | Status: DC
  Administered 2013-04-24 – 2013-04-25 (×2): 0.125 mg via ORAL
  Filled 2013-04-24 (×2): qty 1

## 2013-04-24 MED ORDER — DILTIAZEM HCL 25 MG/5ML IV SOLN: 10.0000 mg | Freq: Four times a day (QID) | INTRAVENOUS | Status: DC | PRN

## 2013-04-24 MED ORDER — APIXABAN 2.5 MG PO TABS: 2.5000 mg | ORAL_TABLET | Freq: Two times a day (BID) | ORAL | Status: AC

## 2013-04-24 MED ORDER — DIGOXIN 125 MCG PO TABS: 0.1250 mg | ORAL_TABLET | Freq: Every day | ORAL | Status: DC

## 2013-04-24 MED ORDER — METOPROLOL TARTRATE 1 MG/ML IV SOLN
5.0000 mg | Freq: Four times a day (QID) | INTRAVENOUS | Status: DC | PRN
  Administered 2013-04-24: 5 mg via INTRAVENOUS
  Filled 2013-04-24: qty 5

## 2013-04-24 NOTE — Progress Notes (Signed)
Physical Therapy Treatment Patient Details Name: [name] MRN: [id number] DOB: 1913-06-20 Today's Date: 04/24/2013 Time: 4098-1191 PT Time Calculation (min): 13 min  PT Assessment / Plan / Recommendation Comments on Treatment Session  Pt agreeable to participate in therapy.   Moves fairly well but fatigues very quickly.  She reports she just feels so weak.  HR sustained low 130's with mobility.  RN made aware.      Follow Up Recommendations  Home health PT;Supervision/Assistance - 24 hour     Does the patient have the potential to tolerate intense rehabilitation     Barriers to Discharge        Equipment Recommendations  None recommended by PT    Recommendations for Other Services    Frequency Min 3X/week   Plan Discharge plan remains appropriate    Precautions / Restrictions Precautions Precautions: Fall Restrictions Weight Bearing Restrictions: No   Pertinent Vitals/Pain HR sustained low 130's with mobility.  RN made aware.  Pt asymptomatic    Mobility  Bed Mobility Bed Mobility: Supine to Sit;Sitting - Scoot to Edge of Bed;Sit to Supine Supine to Sit: 6: Modified independent (Device/Increase time);HOB flat Sitting - Scoot to Edge of Bed: 6: Modified independent (Device/Increase time) Sit to Supine: 6: Modified independent (Device/Increase time);HOB flat Details for Bed Mobility Assistance: Requires increased time but able to complete without physical (A).  Transfers Transfers: Sit to Stand;Stand to Sit Sit to Stand: 4: Min guard;With upper extremity assist;From bed Stand to Sit: 5: Supervision;With upper extremity assist;To bed Details for Transfer Assistance: Guarding for safety due to mild unsteadiness with initial standing Ambulation/Gait Ambulation/Gait Assistance: 4: Min guard Ambulation Distance (Feet): 30 Feet Assistive device: Rolling walker Ambulation/Gait Assistance Details: Guarding for safety.  Pt reports feeling very weak & as though her LE's are  going to give out on her.  Cues for tall posture.  Gait Pattern: Step-through pattern;Decreased stride length;Shuffle;Trunk flexed     PT Goals Acute Rehab PT Goals Time For Goal Achievement: 05/05/13 Potential to Achieve Goals: Good Pt will go Supine/Side to Sit: with modified independence PT Goal: Supine/Side to Sit - Progress: Progressing toward goal Pt will go Sit to Supine/Side: with modified independence;with HOB 0 degrees PT Goal: Sit to Supine/Side - Progress: Progressing toward goal Pt will go Sit to Stand: with supervision;with upper extremity assist PT Goal: Sit to Stand - Progress: Progressing toward goal Pt will go Stand to Sit: with supervision;with upper extremity assist PT Goal: Stand to Sit - Progress: Progressing toward goal Pt will Transfer Bed to Chair/Chair to Bed: with supervision Pt will Ambulate: 51 - 150 feet;with supervision;with rolling walker PT Goal: Ambulate - Progress: Progressing toward goal  Visit Information  Last PT Received On: 04/24/13 Assistance Needed: +1    Subjective Data      Cognition  Cognition Arousal/Alertness: Awake/alert Behavior During Therapy: WFL for tasks assessed/performed Overall Cognitive Status: Within Functional Limits for tasks assessed    Balance     End of Session PT - End of Session Equipment Utilized During Treatment: Gait belt Activity Tolerance: Patient limited by fatigue Patient left: in bed;with call bell/phone within reach Nurse Communication: Mobility status;Other (comment) (Pt's HR 130's)     Verdell Face, PTA [phone] 04/24/2013

## 2013-04-24 NOTE — Progress Notes (Signed)
[name]  79 y.o.  female  Subjective: Denies dyspnea, chest pain or palpitations, no new concerns  Allergy: Ciprofloxacin; Codeine; Penicillins; and Sulfamethoxazole w-trimethoprim  Objective: Vital signs in last 24 hours: Temp:  [97.8 F (36.6 C)-98.4 F (36.9 C)] 98 F (36.7 C) (05/12 0530) Pulse Rate:  [58-124] 58 (05/12 0530) Resp:  [20] 20 (05/12 0530) BP: (103-125)/(50-73) 103/50 mmHg (05/12 0530) SpO2:  [94 %-100 %] 100 % (05/12 0530) Weight:  [95 lb 14.4 oz (43.5 kg)] 95 lb 14.4 oz (43.5 kg) (05/12 0530)  95 lb 14.4 oz (43.5 kg) Body mass index is 18.73 kg/(m^2).  Weight change: -2 lb 6.8 oz (-1.1 kg) Last BM Date: 04/23/13  Intake/Output from previous day: 05/11 0701 - 05/12 0700 In: 240 [P.O.:240] Out: 1203 [Urine:1200; Stool:3] Total I/O since admission:  -860 cc  General- Well developed; no acute distress, fragile appearing OP clear Neck- No JVD, no carotid bruits Lungs- clear  Cardiovascular- irregular rhythm; modest systolic murmur at the left sternal border  Abdomen- normal bowel sounds; soft and non-tender without masses or organomegaly Skin- Warm, no significant lesions Extremities- Nl distal pulses;  1/2+ edema Diffuse muscle atrophy  Lab Results: CBC:    Recent Labs  04/21/13 0900  WBC 7.5  HGB 11.2*  HCT 33.3*  PLT 119*   BMET:   Recent Labs  04/22/13 0751 04/24/13 0510  NA 140 137  K 3.7 4.3  CL 105 100  CO2 26 24  GLUCOSE 120* 145*  BUN 14 19  CREATININE 0.52 0.70  CALCIUM 8.8 9.6   Hepatic Function:    Recent Labs  04/21/13 0900  PROT 6.6  ALBUMIN 3.2*  AST 12  ALT 6  ALKPHOS 49  BILITOT 0.1*   Telemetry: Atrial fibrillation, v rates at night 60s, during the day 90s  Echocardiogram 04/21/2013: Mild biatrial enlargement; moderate LVH; EF-60%; no significant valvular abnormalities.  Medications:  I have reviewed the patient's current medications. Scheduled: . apixaban  2.5 mg Oral BID  . digoxin  0.1875 mg  Oral Daily  . diltiazem  300 mg Oral Daily  . furosemide  20 mg Oral Daily  . gemfibrozil  600 mg Oral Daily  . insulin glargine  14 Units Subcutaneous Daily  . levothyroxine  112 mcg Oral QAC breakfast  . linagliptin  5 mg Oral Daily  . metFORMIN  1,000 mg Oral BID WC  . metoprolol tartrate  25 mg Oral BID  . potassium chloride  10 mEq Oral BID  . pregabalin  50 mg Oral BID  . sodium chloride  3 mL Intravenous Q12H   Assessment/Plan: Atrial fibrillation:  Increase diltiazem to 360mg  daily Decrease digoxin to 0.125mg  daily eliquis 2.5mg  BID Short acting cardizem 30mg  can be taken as needed at home for RVR  H/o CHF with nl EF: I&O remained balanced without clinical evidence for congestive heart failure.  Normocytic anemia: per primary team  Diabetes mellitus:  CBGs of 91-143. Excellent control of diabetes.  No further inpatient workup planned OK to discharge from a CV standpoint Call with questions Follow up with Dr Daleen Squibb in 4 weeks    LOS: 3 days   Hillis Range 04/24/2013, 8:10 AM

## 2013-04-24 NOTE — Discharge Summary (Signed)
Physician Discharge Summary  [name] JXB:[id number] DOB: 1913/11/06 DOA: 04/21/2013  PCP: Ezequiel Kayser, MD  Admit date: 04/21/2013 Discharge date: 04/24/2013  Time spent: 40  minutes  Recommendations for Outpatient Follow-up:  1. Home with outpatient follow up with PCP and cardiology.  Discharge Diagnoses:    Principal Problem:   Atrial fibrillation with RVR   Active Problems:   HYPOTHYROIDISM   DIABETES MELLITUS, TYPE II   HYPERLIPIDEMIA   ANEMIA, CHRONIC   HYPERTENSION   CAD   Thrombocytopenia   Chest pain   Discharge Condition: fair  Diet recommendation: diabetic  Filed Weights   04/22/13 0548 04/23/13 0548 04/24/13 0530  Weight: 45.5 kg (100 lb 5 oz) 44.6 kg (98 lb 5.2 oz) 43.5 kg (95 lb 14.4 oz)    History of present illness:  Please refer to admission H&P for detail, but in brief, 79 year old female with past medical history of CAD, hypothyroidism, diabetes, dyslipidemia and hypertension who presented to [facility] ED with worsening retrosternal chest pain 1 day prior to admission. Chest pain is 7-8/10 in intensity and radiating to left arm. Patient found to have A fib with RVR and admitted stepdown for closer monitoring.   Hospital Course:   Atrial fibrillation with RVR  -admitted to stepdown and placed on Cardizem drip and switched to oral long acting Cardizem daily once HR better controlled . Patient transferred to telemetry but  HR remained  elevated up to 120 on the monitor.  iHR did not improve on further increasing cardizem dose and adding metoprolol so she was Loaded with IV digoxin 0.5 mg and started on oral digoxin.   Increased cardizem dose to 360 mg and reuduced dose of digoxin to 0.0625 mg daily given risk for toxicity Cardiology recommends prescribing additional 30 mg Cardizem upon discharge as needed for symptoms at home. Given HR persistently elevated even on above regimen , i started her on low dose amiodarone after discussing with cardiology. Her  HR today is stable in 80s.  - 2D echo with normal EF . - ASA and Plavix discontinued (plavix was started recently for TIA symptoms)  -started on low dose eliquis.  -patient stable to be discharge home with outpt cardiology follow up.  Chest pain  - likely due to rapid afib. Resolved once HR controlled.  - cardiac enzymes negative.   Chronic systolic heart failure(ischemic?)  - Lasix dose increased by cardiology to 20 mg daily.  HYPOTHYROIDISM  - cont levothyroxine  - TSH normal   DIABETES MELLITUS, TYPE II  - cont home dose of insulin, metformin and linagliptin   Thrombocytopenia  - chronic and stable  Patient is clinically stable . Her HR has been better controled this morning. She will follow up with her cardiologist in 4 weeks. Needs BMET checked as outpt  while on digoxin.   Code status: DNR  Dispo: home   Consultants:  lebeuar cardiology     Procedures:  None   Antibiotics:  NONE      Discharge Exam: Filed Vitals:   04/23/13 1753 04/23/13 1808 04/23/13 2152 04/24/13 0530  BP:   125/57 103/50  Pulse: 84 81 82 58  Temp:   98.4 F (36.9 C) 98 F (36.7 C)  TempSrc:   Oral Oral  Resp:   20 20  Height:      Weight:    43.5 kg (95 lb 14.4 oz)  SpO2:   95% 100%    General: Elderly thin built female in NAD  Cardiovascular: S1&S2 irregular, no murmurs, rubs or gallop  Respiratory:clear b/l, no added sounds  Abdomen: soft, NT, ND, BS+  Musculoskeletal: warm, no edema  CNS: AAOX3   Discharge Instructions     Medication List    STOP taking these medications       amLODipine 10 MG tablet  Commonly known as:  NORVASC     aspirin EC 81 MG tablet     atenolol 25 MG tablet  Commonly known as:  TENORMIN     clopidogrel 75 MG tablet  Commonly known as:  PLAVIX      TAKE these medications       apixaban 2.5 MG Tabs tablet  Commonly known as:  ELIQUIS  Take 1 tablet (2.5 mg total) by mouth 2 (two) times daily.     digoxin 0.0625 MG tablet   Commonly known as:  LANOXIN  Take 1 tablet (0.0625 mg total) by mouth daily.     diltiazem 30 MG tablet  Commonly known as:  CARDIZEM  Take 1 tablet (30 mg total) by mouth 4  times daily as needed (increased HR/ palpitations).     diltiazem 360 MG 24 hr capsule  Commonly known as:  CARDIZEM CD  Take 1 capsule (360 mg total) by mouth daily.     furosemide 20 MG tablet  Commonly known as:  LASIX  Take 1 tablet (20 mg total) by mouth daily.     gemfibrozil 600 MG tablet  Commonly known as:  LOPID  Take 600 mg by mouth daily.     insulin glargine 100 UNIT/ML injection  Commonly known as:  LANTUS  Inject 14 Units into the skin daily.     isosorbide mononitrate 30 MG 24 hr tablet  Commonly known as:  IMDUR  Take 30 mg by mouth 2 (two) times daily.     levothyroxine 112 MCG tablet  Commonly known as:  SYNTHROID, LEVOTHROID  Take 112 mcg by mouth daily.     metFORMIN 1000 MG tablet  Commonly known as:  GLUCOPHAGE  Take 1,000 mg by mouth 2 (two) times daily with a meal.     metoprolol tartrate 25 MG tablet  Commonly known as:  LOPRESSOR  Take 1 tablet (25 mg total) by mouth 2 (two) times daily.     nitroGLYCERIN 0.4 MG SL tablet  Commonly known as:  NITROSTAT  Place 0.4 mg under the tongue every 5 (five) minutes as needed for chest pain.     potassium chloride 10 MEQ CR capsule  Commonly known as:  MICRO-K  Take 10 mEq by mouth 2 (two) times a week. Tuesday and Thursday     pregabalin 50 MG capsule  Commonly known as:  LYRICA  Take 50 mg by mouth 2 (two) times daily.     sitaGLIPtin 50 MG tablet  Commonly known as:  JANUVIA  Take 50 mg by mouth daily.       Amiodarone 200 mg  1 tablet po daily   Allergies  Allergen Reactions  . Ciprofloxacin   . Codeine   . Penicillins     REACTION: Edema  . Sulfamethoxazole W-Trimethoprim     REACTION: Throat closing       Follow-up Information   Follow up with PERINI,MARK A, MD In 1 week.   Contact information:    [address] [place] [address] [phone]       Follow up with Valera Castle, MD In 4 weeks.   Contact information:  [address] [place] [address] [phone]        The results of significant diagnostics from this hospitalization (including imaging, microbiology, ancillary and laboratory) are listed below for reference.    Significant Diagnostic Studies: Dg Chest 2 View  04/21/2013  *RADIOLOGY REPORT*  Clinical Data: 79 year old female with chest pain.  CHEST - 2 VIEW  Comparison: 02/03/2011 and earlier.  Findings: AP portable semi upright view at 0120 hours.  Mildly lower lung volumes. Stable cardiomegaly and mediastinal contours. Visualized tracheal air column is within normal limits.  No pneumothorax.  Stable pulmonary vascularity without pulmonary edema.  Chronic retrocardiac hypoventilation appears not significantly changed.  No definite effusion.  No consolidation identified.  IMPRESSION: Stable cardiomegaly.  No acute cardiopulmonary abnormality.   Original Report Authenticated By: Erskine Speed, M.D.     Microbiology: Recent Results (from the past 240 hour(s))  MRSA PCR SCREENING     Status: None   Collection Time    04/21/13  5:23 AM      Result Value Range Status   MRSA by PCR NEGATIVE  NEGATIVE Final   Comment:            The GeneXpert MRSA Assay (FDA     approved for NASAL specimens     only), is one component of a     comprehensive MRSA colonization     surveillance program. It is not     intended to diagnose MRSA     infection nor to guide or     monitor treatment for     MRSA infections.     Labs: Basic Metabolic Panel:  Recent Labs Lab 04/21/13 0048 04/21/13 0900 04/22/13 0751 04/24/13 0510  NA 140 140 140 137  K 3.7 3.7 3.7 4.3  CL 105 104 105 100  CO2  --  26 26 24   GLUCOSE 151* 187* 120* 145*  BUN 19 18 14 19   CREATININE 0.60 0.57 0.52 0.70  CALCIUM  --  9.8 8.8 9.6   Liver Function Tests:  Recent Labs Lab  04/21/13 0900  AST 12  ALT 6  ALKPHOS 49  BILITOT 0.1*  PROT 6.6  ALBUMIN 3.2*   No results found for this basename: LIPASE, AMYLASE,  in the last 168 hours No results found for this basename: AMMONIA,  in the last 168 hours CBC:  Recent Labs Lab 04/21/13 0037 04/21/13 0048 04/21/13 0900  WBC 9.6  --  7.5  NEUTROABS 6.1  --   --   HGB 11.8* 11.9* 11.2*  HCT 35.3* 35.0* 33.3*  MCV 98.3  --  98.2  PLT 128*  --  119*   Cardiac Enzymes: No results found for this basename: CKTOTAL, CKMB, CKMBINDEX, TROPONINI,  in the last 168 hours BNP: BNP (last 3 results)  Recent Labs  04/21/13 0329  PROBNP 905.6*   CBG:  Recent Labs Lab 04/23/13 1233 04/23/13 1547 04/23/13 2146 04/23/13 2148 04/24/13 0628  GLUCAP 93 75 243* 255* 139*       Signed:  Alayja Armas  [facility] 04/24/2013, 9:07 AM

## 2013-04-24 NOTE — Progress Notes (Signed)
Patient's Heart rate is currently below 120s, MD orders are to call cardiology if patient's Heart increases again and sustains in the 130s; will continue to monitor patient.  Lorretta Harp RN

## 2013-04-24 NOTE — Progress Notes (Signed)
Patient's Heart Rate continues to sustain in the 130s at rest, MD notified and orders given will continue to monitor patient.  Lorretta Harp RN

## 2013-04-24 NOTE — Progress Notes (Signed)
[facility] PROGRESS NOTE  [name] ZOX:[id number] DOB: 03/27/1913 DOA: 04/21/2013 PCP: Ezequiel Kayser, MD  Brief narrative:  79 year old female with past medical history of CAD, hypothyroidism, diabetes, dyslipidemia and hypertension who presented to [facility] ED with worsening retrosternal chest pain 1 day prior to admission. Chest pain is 7-8/10 in intensity and radiating to left arm. Patient found to have A fib with RVR and admitted stepdown for closer monitoring.    Assessment/Plan:  Chest pain  - likely due to rapid afib. Resolved once HR controlled.  - cardiac enzymes negative.   Atrial fibrillation with RVR  -placed Cardizem drip and switched to oral long acting Cardizem 240 mg daily. HR elevated up to 120 on the monitor. Will increased dose to 300 mg daily and added BB. unable to increase dose due to soft BP. Given persistently elevated heart rate she was loaded with IV digoxin on 5/11 and started on low-dose oral digoxin - 2D echo with normal EF  -Cardizem was increased to 360 mg daily this morning. Plan for discharge today however the heart rate increased to 130s. She was given a dose of 30 mg Cardizem after discussion with cardiology started on low-dose amiodarone 200 mg daily. D/c held. Ordered prn IV lopressor. The heart is still not controlled then she may need to be placed back on Cardizem drip or loaded with IV amiodarone. - appreciate cardiology eval.pt follows with [facility] as outpt  - ASA and Plavix discontinued (plavix was started recently for TIA symptoms)  -started on low dose eliquis.   Chronic systolic heart failure(ischemic?)  - Lasix dose increased by cardiology   HYPOTHYROIDISM  - cont levothyroxine  - TSH normal   DIABETES MELLITUS, TYPE II  - cont home dose of insulin, metformin and linagliptin   Thrombocytopenia  - chronic   Code status: full  Dispo: home once HR controlled   Consultants:  lebeuar cardiology  Procedures:  None Antibiotics:   NONE  HPI/Subjective:  Patient informs feeling fatigued. Heart rate increased to 130s on the monitor   Objective: Filed Vitals:   04/24/13 0530 04/24/13 0900 04/24/13 1326 04/24/13 1511  BP: 103/50 105/57 85/53 119/57  Pulse: 58  94 134  Temp: 98 F (36.7 C)  98.2 F (36.8 C)   TempSrc: Oral  Oral   Resp: 20 18 16 18   Height:      Weight: 43.5 kg (95 lb 14.4 oz)     SpO2: 100% 98% 98% 97%    Intake/Output Summary (Last 24 hours) at 04/24/13 1646 Last data filed at 04/24/13 1525  Gross per 24 hour  Intake    483 ml  Output   1402 ml  Net   -919 ml   Filed Weights   04/22/13 0548 04/23/13 0548 04/24/13 0530  Weight: 45.5 kg (100 lb 5 oz) 44.6 kg (98 lb 5.2 oz) 43.5 kg (95 lb 14.4 oz)    Exam:  General: Elderly thin built female in NAD  Cardiovascular: S1&S2 irregular, no murmurs, rubs or gallop  Respiratory:clear b/l, no added sounds  Abdomen: soft, NT, ND, BS+  Musculoskeletal: warm, no edema  CNS: AAOX3   Data Reviewed: Basic Metabolic Panel:  Recent Labs Lab 04/21/13 0048 04/21/13 0900 04/22/13 0751 04/24/13 0510  NA 140 140 140 137  K 3.7 3.7 3.7 4.3  CL 105 104 105 100  CO2  --  26 26 24   GLUCOSE 151* 187* 120* 145*  BUN 19 18 14 19   CREATININE 0.60  0.57 0.52 0.70  CALCIUM  --  9.8 8.8 9.6   Liver Function Tests:  Recent Labs Lab 04/21/13 0900  AST 12  ALT 6  ALKPHOS 49  BILITOT 0.1*  PROT 6.6  ALBUMIN 3.2*   No results found for this basename: LIPASE, AMYLASE,  in the last 168 hours No results found for this basename: AMMONIA,  in the last 168 hours CBC:  Recent Labs Lab 04/21/13 0037 04/21/13 0048 04/21/13 0900  WBC 9.6  --  7.5  NEUTROABS 6.1  --   --   HGB 11.8* 11.9* 11.2*  HCT 35.3* 35.0* 33.3*  MCV 98.3  --  98.2  PLT 128*  --  119*   Cardiac Enzymes: No results found for this basename: CKTOTAL, CKMB, CKMBINDEX, TROPONINI,  in the last 168 hours BNP (last 3 results)  Recent Labs  04/21/13 0329  PROBNP 905.6*    CBG:  Recent Labs Lab 04/23/13 2146 04/23/13 2148 04/24/13 0628 04/24/13 1127 04/24/13 1642  GLUCAP 243* 255* 139* 198* 149*    Recent Results (from the past 240 hour(s))  MRSA PCR SCREENING     Status: None   Collection Time    04/21/13  5:23 AM      Result Value Range Status   MRSA by PCR NEGATIVE  NEGATIVE Final   Comment:            The GeneXpert MRSA Assay (FDA     approved for NASAL specimens     only), is one component of a     comprehensive MRSA colonization     surveillance program. It is not     intended to diagnose MRSA     infection nor to guide or     monitor treatment for     MRSA infections.     Studies: No results found.  Scheduled Meds: . amiodarone  200 mg Oral Daily  . apixaban  2.5 mg Oral BID  . digoxin  0.125 mg Oral Daily  . diltiazem  360 mg Oral Daily  . furosemide  20 mg Oral Daily  . gemfibrozil  600 mg Oral Daily  . insulin glargine  14 Units Subcutaneous Daily  . levothyroxine  112 mcg Oral QAC breakfast  . linagliptin  5 mg Oral Daily  . metFORMIN  1,000 mg Oral BID WC  . metoprolol tartrate  25 mg Oral BID  . potassium chloride  10 mEq Oral BID  . pregabalin  50 mg Oral BID  . sodium chloride  3 mL Intravenous Q12H   Continuous Infusions:     Time spent: 35 minutes    [name]  [facility] Pager [phone]. If 7PM-7AM, please contact night-coverage at [email or web address], password [facility] 04/24/2013, 4:46 PM  LOS: 3 days

## 2013-04-24 NOTE — Progress Notes (Signed)
Patient's Heart rate is currently 101; will continue to monitor.  Lorretta Harp RN

## 2013-04-24 NOTE — Progress Notes (Signed)
PCP/Courtesy visit.  Patient in good spirits. Amazing lady at her age.  Appreciate care and all med adjustments for afib, etc... She has an out of facility dnr order for the last 3 years and she would want that status here so i will make her DNR. We will see her soon after eventual d/c home.  [employer].

## 2013-04-25 LAB — DIGOXIN LEVEL: Digoxin Level: 1.8 ng/mL (ref 0.8–2.0)

## 2013-04-25 MED ORDER — AMIODARONE HCL 200 MG PO TABS: 200.0000 mg | ORAL_TABLET | Freq: Every day | ORAL | Status: DC

## 2013-04-25 MED ORDER — DILTIAZEM HCL 30 MG PO TABS: 30.0000 mg | ORAL_TABLET | Freq: Four times a day (QID) | ORAL | Status: AC | PRN

## 2013-04-25 MED ORDER — DILTIAZEM HCL ER COATED BEADS 360 MG PO CP24: 360.0000 mg | ORAL_CAPSULE | Freq: Every day | ORAL | Status: DC

## 2013-04-25 MED ORDER — AMIODARONE HCL 200 MG PO TABS: 200.0000 mg | ORAL_TABLET | Freq: Every day | ORAL | Status: AC

## 2013-04-25 MED ORDER — NITROGLYCERIN 0.4 MG SL SUBL
SUBLINGUAL_TABLET | SUBLINGUAL | Status: AC
  Filled 2013-04-25: qty 25

## 2013-04-25 MED ORDER — DIGOXIN 62.5 MCG PO TABS: 0.0625 mg | ORAL_TABLET | Freq: Every day | ORAL | Status: DC

## 2013-04-25 NOTE — Progress Notes (Signed)
Pt d/c to home w/ family. D/c instructions and medications reviewed with Pt by charge RN

## 2013-04-25 NOTE — Progress Notes (Signed)
Noted new CM referral for [facility] PT.  In to speak with pt. And daughter about home health services.   Daughter states pt. Has used [facility] in past, and was satisfied with their care.  TC to [place], with [facility] to give referral for [facility].  Pt. To dc home with dtr. Today. Tera Mater, RN, BSN NCM [phone]

## 2013-04-25 NOTE — Progress Notes (Signed)
The patient was complaining of pain in her chest area.  Her VS were stable.  BP 132/50.  HR 67.  O2 99% on RA.  Respirations 16.  When the sublingual nitro was brought into the room, the patient stated that she no longer had the pain and thought that it was muscular.  The [facility] on call was notified.  The RN will continue to monitor the patient.

## 2013-05-22 ENCOUNTER — Telehealth: Payer: Self-pay | Admitting: Cardiology

## 2013-05-22 MED ORDER — DILTIAZEM HCL ER COATED BEADS 180 MG PO CP24: 180.0000 mg | ORAL_CAPSULE | Freq: Every day | ORAL | Status: AC

## 2013-05-22 NOTE — Telephone Encounter (Signed)
Spoke with Dennie Bible, patient's caregiver who states that patient's diastolic BP has been <50 for 8-10 days.  Dennie Bible states that Dr. Waynard Edwards, PCP decreased patient's Metoprolol on 5/20 and BP remains low.  Patient's current BP is 113/40 with HR 54.  Dennie Bible states that patient's normal BP is 130's over high 50's.  Dennie Bible is wondering if patient's Amiodarone dosage needs to be decreased.  Patient currently takes Amiodarone 200 mg QD.  I advised Pat that Dr. Jens Som is out of the office this week and that I will take patient's complaint to Dr. Elease Hashimoto, DOD.  Pat verbalized understanding.

## 2013-05-22 NOTE — Telephone Encounter (Signed)
Orson Eva, patient's caretaker of Dr. Harvie Bridge advice to return to taking Metoprolol 25 mg BID.  Dennie Bible states this is the patient's current dosage.  I explained that I thought the dosage had been decreased recently.  Dennie Bible states Metoprolol 25 mg BID is the decreased dose.  I advised her to continue with this dose.  Per Dr. Elease Hashimoto patient is to decrease Diltiazem to 180 mg QD.  Dennie Bible states that patient's current dose of Diltiazem is 240 mg.  I explained to Dennie Bible that these medication changes have not been documented on patient's chart.  Pat verbalized understanding of patient's Diltiazem 180 mg QD dose and I changed this in the patient's chart.  Per Dr. Elease Hashimoto I also advised her that patient may need to increase her salt.  Dennie Bible states this will probably help as they have maintained a low sodium diet for awhile.  I also encouraged her to maintain good po fluid intake.  Pat verbalized understanding of all instructions.

## 2013-05-22 NOTE — Telephone Encounter (Signed)
New problem  Pt's caregiver states that pt BP was 115/40.  She states for the past 9 days the patient diastolic number has been low. She wants to speak with the nurse to reduce the amiodarone.  She said that spoke with the patient's PCP and was advised to call here. (Pt last seen by Dr Daleen Squibb in the hospital in May and in office in 2012)

## 2013-05-30 ENCOUNTER — Encounter: Payer: Self-pay | Admitting: Internal Medicine

## 2013-06-06 ENCOUNTER — Ambulatory Visit (INDEPENDENT_AMBULATORY_CARE_PROVIDER_SITE_OTHER): Payer: Medicare Other | Admitting: Physician Assistant

## 2013-06-06 ENCOUNTER — Encounter: Payer: Self-pay | Admitting: Physician Assistant

## 2013-06-06 VITALS — BP 140/52 | HR 55 | Ht 60.0 in | Wt 96.0 lb

## 2013-06-06 DIAGNOSIS — I5032 Chronic diastolic (congestive) heart failure: Secondary | ICD-10-CM

## 2013-06-06 DIAGNOSIS — I2510 Atherosclerotic heart disease of native coronary artery without angina pectoris: Secondary | ICD-10-CM

## 2013-06-06 DIAGNOSIS — I4891 Unspecified atrial fibrillation: Secondary | ICD-10-CM

## 2013-06-06 DIAGNOSIS — I10 Essential (primary) hypertension: Secondary | ICD-10-CM

## 2013-06-06 NOTE — Patient Instructions (Addendum)
Labs to be drawn at Dr. Laurey Morale office.  Your physician recommends that you schedule a follow-up appointment in: 3 months with Dr. Shirlee Latch.

## 2013-06-06 NOTE — Progress Notes (Signed)
[address] [place], [place]  [address] Phone: [phone] Fax:  [phone]  Date:  06/06/2013   ID:  [name], DOB 21-Jan-1913, MRN [id number]  PCP:  Ezequiel Kayser, MD  Cardiologist:  Dr. Valera Castle     History of Present Illness: [name] is a 79 y.o. female who returns for f/u after a recent admission to the hospital for CP in the setting of AFib with RVR.  She has a hx of CAD, s/p non-STEMI in 2009 (medical therapy pursued due to advanced age), atrial fibrillation, HTN, HL, PAD, DM2, hypothyroidism.   She was admitted 5/9-5/12. She presented with chest discomfort and was found to be in atrial fibrillation with RVR. She was evaluated by cardiology. Aspirin and Plavix were discontinued. She was placed on Eliquis 2.5 mg twice a day for stroke prophylaxis. She was placed on diltiazem and metoprolol for heart rate control. Her blood pressure remained soft and she was placed on digoxin. She was to be d/c with prn diltiazem 30 mg.  According to the d/c summary, after d/w cardiology, she was ultimately placed on low-dose amiodarone for heart rate control as well. Echocardiogram 04/21/13:  Moderate LVH, EF 60%, diastolic dysfunction, aortic sclerosis without stenosis, MAC, mild MR, mild LAE, mildly reduced RVSF, mild RAE, PASP 43. CEs remained negative.  She did require mild diuresis for volume overload.  Of note, I reviewed her chart and she was in NSR at the time of d/c.  Since d/c, she has fallen x 1.  She lost balance putting her pants on while sitting in her chair.  She is here today with her daughter and caregiver.  She has around the clock care.  Patient scraped her left leg and has seen her PCP.  She is being referred to the wound clinic.  She scraped her scalp.  No HAs.  No CP.  No dyspnea.  No syncope or near syncope.  No edema.   Labs (5/14):  K 4.3, Cr 0.70, ALT 6, Hgb 11.2, PLT 119K, TSH 1.324   Wt Readings from Last 3 Encounters:  04/25/13 94 lb 9.6  oz (42.91 kg)  11/25/12 90 lb (40.824 kg)  11/25/12 90 lb (40.824 kg)     Past Medical History  Diagnosis Date  . NSTEMI (non-ST elevated myocardial infarction) 2009    Treated medically, never cathed, EF 45%  . HLD (hyperlipidemia)   . HTN (hypertension)   . Peripheral vascular disease, unspecified     eval by [facility] in the past  . Anemia   . Chronic pancreatitis   . Type II or unspecified type diabetes mellitus without mention of complication, not stated as uncontrolled   . Hypothyroidism   . Osteoporosis, unspecified   . Calculus of kidney   . Atrial fibrillation     admx 05/2013 with AF with RVR;  started on amiodarone Rx;  CHADS2-VASc=6 (Eliquis 2.5 bid started)   . Squamous cell carcinoma in situ of skin of lower leg, right   . Hx of echocardiogram     a. Echocardiogram 04/21/13:  Moderate LVH, EF 60%, diastolic dysfunction, aortic sclerosis without stenosis, MAC, mild MR, mild LAE, mildly reduced RVSF, mild RAE, PASP 43    Current Outpatient Prescriptions  Medication Sig Dispense Refill  . amiodarone (PACERONE) 200 MG tablet Take 1 tablet (200 mg total) by mouth daily.  30 tablet  0  . apixaban (ELIQUIS) 2.5 MG TABS tablet Take 1 tablet (2.5 mg total) by mouth  2 (two) times daily.  60 tablet  0  . digoxin 62.5 MCG TABS Take 0.0625 mg by mouth daily.  30 tablet  0  . diltiazem (CARDIZEM CD) 180 MG 24 hr capsule Take 1 capsule (180 mg total) by mouth daily.  90 capsule  3  . diltiazem (CARDIZEM) 30 MG tablet Take 1 tablet (30 mg total) by mouth every 6 (six) hours as needed (increased HR/ palpitations).  90 tablet  0  . furosemide (LASIX) 20 MG tablet Take 1 tablet (20 mg total) by mouth daily.  30 tablet  0  . gemfibrozil (LOPID) 600 MG tablet Take 600 mg by mouth daily.       . insulin glargine (LANTUS) 100 UNIT/ML injection Inject 14 Units into the skin daily.       . isosorbide mononitrate (IMDUR) 30 MG 24 hr tablet Take 30 mg by mouth 2 (two) times daily.      Marland Kitchen levothyroxine  (SYNTHROID, LEVOTHROID) 112 MCG tablet Take 112 mcg by mouth daily.        . metFORMIN (GLUCOPHAGE) 1000 MG tablet Take 1,000 mg by mouth 2 (two) times daily with a meal.      . metoprolol tartrate (LOPRESSOR) 25 MG tablet Take 1 tablet (25 mg total) by mouth 2 (two) times daily.  60 tablet  0  . nitroGLYCERIN (NITROSTAT) 0.4 MG SL tablet Place 0.4 mg under the tongue every 5 (five) minutes as needed for chest pain.      . potassium chloride (MICRO-K) 10 MEQ CR capsule Take 10 mEq by mouth 2 (two) times a week. Tuesday and Thursday      . pregabalin (LYRICA) 50 MG capsule Take 50 mg by mouth 2 (two) times daily.      . sitaGLIPtin (JANUVIA) 50 MG tablet Take 50 mg by mouth daily.       No current facility-administered medications for this visit.    Allergies:    Allergies  Allergen Reactions  . Ciprofloxacin   . Codeine   . Penicillins     REACTION: Edema  . Sulfamethoxazole W-Trimethoprim     REACTION: Throat closing    Social History:  The patient  reports that she has never smoked. She has never used smokeless tobacco. She reports that she does not drink alcohol or use illicit drugs.   ROS:  Please see the history of present illness.   No melena, hematochezia, hematuria.    All other systems reviewed and negative.   PHYSICAL EXAM: VS:  BP 140/52  Pulse 55  Ht 5' (1.524 m)  Wt 96 lb (43.545 kg)  BMI 18.75 kg/m2 Well nourished, well developed, in no acute distress HEENT: normal Neck: no JVD at 90 Cardiac:  normal S1, S2; RRR; no murmur Lungs:  clear to auscultation bilaterally, no wheezing, rhonchi or rales Abd: soft, nontender, no hepatomegaly Ext: no edema Skin: warm and dry Neuro:  CNs 2-12 intact, no focal abnormalities noted  EKG:  Sinus brady, HR 85, 1st degree AV block, PR interval 210 ms, nonspecific ST-T wave changes, QTc 464     ASSESSMENT AND PLAN:  1. Atrial Fibrillation:  Maintaining NSR. She remains on amiodarone and Eliquis. Her heart rate is somewhat  depressed. She has already been taken off of Digoxin.  With her normal LVF, this would be recommended.  Given her advanced age, I will discontinue her metoprolol to avoid significant bradycardia. At this point, it still appears that she may continue anticoagulation. Arrange follow  up basic metabolic panel, CBC, LFTs and TSH in 6 weeks.  Consider reducing dose of amiodarone in f/u. 2. CAD:  No angina. Aspirin has been discontinued secondary to initiation of Eliquis. 3. Chronic Diastolic CHF:  Volume stable. Continue current therapy. Request recent labs performed by her PCP. 4. Hypertension:  Controlled. 5. Hyperlipidemia:  Continue Lopid. 6. Disposition: She was evaluated by Dr. Shirlee Latch in the hospital. I will have her follow up with him long-term. Arrange appointment in 3 months.  Signed, Tereso Newcomer, PA-C  06/06/2013 8:28 AM

## 2013-06-09 ENCOUNTER — Other Ambulatory Visit: Payer: Self-pay | Admitting: *Deleted

## 2013-06-12 ENCOUNTER — Encounter (HOSPITAL_BASED_OUTPATIENT_CLINIC_OR_DEPARTMENT_OTHER): Payer: Medicare Other | Attending: Plastic Surgery

## 2013-06-12 DIAGNOSIS — E119 Type 2 diabetes mellitus without complications: Secondary | ICD-10-CM | POA: Insufficient documentation

## 2013-06-12 DIAGNOSIS — I872 Venous insufficiency (chronic) (peripheral): Secondary | ICD-10-CM | POA: Insufficient documentation

## 2013-06-12 DIAGNOSIS — L97809 Non-pressure chronic ulcer of other part of unspecified lower leg with unspecified severity: Secondary | ICD-10-CM | POA: Insufficient documentation

## 2013-06-12 DIAGNOSIS — E785 Hyperlipidemia, unspecified: Secondary | ICD-10-CM | POA: Insufficient documentation

## 2013-06-12 DIAGNOSIS — I10 Essential (primary) hypertension: Secondary | ICD-10-CM | POA: Insufficient documentation

## 2013-06-12 DIAGNOSIS — M810 Age-related osteoporosis without current pathological fracture: Secondary | ICD-10-CM | POA: Insufficient documentation

## 2013-06-12 DIAGNOSIS — Z79899 Other long term (current) drug therapy: Secondary | ICD-10-CM | POA: Insufficient documentation

## 2013-06-12 DIAGNOSIS — I739 Peripheral vascular disease, unspecified: Secondary | ICD-10-CM | POA: Insufficient documentation

## 2013-06-13 NOTE — Progress Notes (Signed)
[facility]  NAME:  [name], [name] NO.:  [id number]  MEDICAL RECORD NO.:  [id number]      DATE OF BIRTH:  1913/09/27  PHYSICIAN:  Wayland Denis, DO       VISIT DATE:  06/12/2013                                  OFFICE VISIT   HISTORY:  The patient is a 79 year old female, who is here for evaluation of a right anterior lower extremity ulcer, chronic now, a month ago when she fell out of the wheelchair.  She has been using some triple antibiotic ointment on the area, but it has been slow to improve. She had vascular studies done by [facility] recently showing disease, but no operation, intervention, or procedure suggested.  PAST MEDICAL HISTORY:  Positive for hyperlipidemia, hypertension, peripheral vascular disease, anemia, chronic pancreatitis, type 2 diabetes, AFib, squamous cell carcinoma, osteoporosis, venous insufficiency.  PAST SURGICAL HISTORY:  Surgeries include squamous cell carcinoma excision, bilateral cataracts, ingrown toenail, appendectomy, hysterectomy, hemorrhoidectomy, a left hip fracture, and a toenail removed.  MEDICATIONS:  Include amiodarone, __________, digoxin, Cardizem, Lasix, Lopid, Lantus, Imdur, Synthroid, metformin, metoprolol, Nitrostat, Lyrica, Januvia, and Macrobid.  ALLERGIES:  Include PENICILLIN, CODEINE, CIPRO, and BACTRIM.  REVIEW OF SYSTEMS:  Otherwise negative.  PHYSICAL EXAMINATION:  The patient is alert.  She is not fully oriented and not fully able to give a history either, but her daughter and caregiver are with her.  She is breathing unlabored.  Heart rate is regular.  Pulses are present, but weak in the lower extremities with a lot of varicose veins.  The ulcer is 3.5 x 2 x 0.1, mostly superficial, very good healing, granulating wound bed.  At this point, I recommend elevation, Unna boot, and silver collagen, increasing her protein, multivitamins, and we will see her back in a week.     Wayland Denis, DO     CS/MEDQ  D:  06/12/2013  T:  06/12/2013  Job:  [id number]

## 2013-06-19 ENCOUNTER — Encounter (HOSPITAL_BASED_OUTPATIENT_CLINIC_OR_DEPARTMENT_OTHER): Payer: Medicare Other | Attending: Plastic Surgery

## 2013-06-19 DIAGNOSIS — L97809 Non-pressure chronic ulcer of other part of unspecified lower leg with unspecified severity: Secondary | ICD-10-CM | POA: Insufficient documentation

## 2013-06-27 NOTE — Progress Notes (Signed)
[facility]  NAME:  [name], HOGENSON NO.:  [id number]  MEDICAL RECORD NO.:  [id number]      DATE OF BIRTH:  June 21, 1913  PHYSICIAN:  Wayland Denis, DO       VISIT DATE:  06/26/2013                                  OFFICE VISIT   The patient is a 79 year old, here for followup on her right lower extremity ulcer from trauma.  She is doing extremely well.  There is complete healing of the area.  There is no sign of infection and no other injuries.  Her medications are unchanged.  Review of systems are negative.  On exam, she is alert, oriented to person.  She is pleasant.  Pupils equal, no cervical lymphadenopathy.  Her breathing is unlabored.  Her heart is regular.  The wound has completely healed.  Recommend lotion, compression stockings, and follow up as needed.     Wayland Denis, DO     CS/MEDQ  D:  06/26/2013  T:  06/27/2013  Job:  [id number]

## 2013-07-19 ENCOUNTER — Encounter (HOSPITAL_COMMUNITY): Payer: Self-pay | Admitting: Emergency Medicine

## 2013-07-19 ENCOUNTER — Emergency Department (HOSPITAL_COMMUNITY)
Admission: EM | Admit: 2013-07-19 | Discharge: 2013-07-19 | Disposition: A | Discharge status: Home / Self Care | Payer: Medicare Other | Attending: Emergency Medicine | Admitting: Emergency Medicine

## 2013-07-19 DIAGNOSIS — R112 Nausea with vomiting, unspecified: Secondary | ICD-10-CM | POA: Insufficient documentation

## 2013-07-19 DIAGNOSIS — Z87442 Personal history of urinary calculi: Secondary | ICD-10-CM | POA: Insufficient documentation

## 2013-07-19 DIAGNOSIS — Z8679 Personal history of other diseases of the circulatory system: Secondary | ICD-10-CM | POA: Insufficient documentation

## 2013-07-19 DIAGNOSIS — I252 Old myocardial infarction: Secondary | ICD-10-CM | POA: Insufficient documentation

## 2013-07-19 DIAGNOSIS — R197 Diarrhea, unspecified: Secondary | ICD-10-CM | POA: Insufficient documentation

## 2013-07-19 DIAGNOSIS — Z7902 Long term (current) use of antithrombotics/antiplatelets: Secondary | ICD-10-CM | POA: Insufficient documentation

## 2013-07-19 DIAGNOSIS — E039 Hypothyroidism, unspecified: Secondary | ICD-10-CM | POA: Insufficient documentation

## 2013-07-19 DIAGNOSIS — N390 Urinary tract infection, site not specified: Secondary | ICD-10-CM

## 2013-07-19 DIAGNOSIS — Z794 Long term (current) use of insulin: Secondary | ICD-10-CM | POA: Insufficient documentation

## 2013-07-19 DIAGNOSIS — Z8739 Personal history of other diseases of the musculoskeletal system and connective tissue: Secondary | ICD-10-CM | POA: Insufficient documentation

## 2013-07-19 DIAGNOSIS — Z880 Allergy status to penicillin: Secondary | ICD-10-CM | POA: Insufficient documentation

## 2013-07-19 DIAGNOSIS — I4891 Unspecified atrial fibrillation: Secondary | ICD-10-CM | POA: Insufficient documentation

## 2013-07-19 DIAGNOSIS — D72829 Elevated white blood cell count, unspecified: Secondary | ICD-10-CM | POA: Insufficient documentation

## 2013-07-19 DIAGNOSIS — E785 Hyperlipidemia, unspecified: Secondary | ICD-10-CM | POA: Insufficient documentation

## 2013-07-19 DIAGNOSIS — E860 Dehydration: Secondary | ICD-10-CM

## 2013-07-19 DIAGNOSIS — E119 Type 2 diabetes mellitus without complications: Secondary | ICD-10-CM | POA: Insufficient documentation

## 2013-07-19 DIAGNOSIS — I10 Essential (primary) hypertension: Secondary | ICD-10-CM | POA: Insufficient documentation

## 2013-07-19 DIAGNOSIS — Z9189 Other specified personal risk factors, not elsewhere classified: Secondary | ICD-10-CM | POA: Insufficient documentation

## 2013-07-19 DIAGNOSIS — Z85820 Personal history of malignant melanoma of skin: Secondary | ICD-10-CM | POA: Insufficient documentation

## 2013-07-19 DIAGNOSIS — Z862 Personal history of diseases of the blood and blood-forming organs and certain disorders involving the immune mechanism: Secondary | ICD-10-CM | POA: Insufficient documentation

## 2013-07-19 DIAGNOSIS — Z79899 Other long term (current) drug therapy: Secondary | ICD-10-CM | POA: Insufficient documentation

## 2013-07-19 LAB — COMPREHENSIVE METABOLIC PANEL
AST: 16 U/L (ref 0–37)
Albumin: 3.2 g/dL — ABNORMAL LOW (ref 3.5–5.2)
BUN: 18 mg/dL (ref 6–23)
Calcium: 9.5 mg/dL (ref 8.4–10.5)
Creatinine, Ser: 0.82 mg/dL (ref 0.50–1.10)
GFR calc non Af Amer: 58 mL/min — ABNORMAL LOW (ref >90)
Total Bilirubin: 0.4 mg/dL (ref 0.3–1.2)

## 2013-07-19 LAB — URINALYSIS, ROUTINE W REFLEX MICROSCOPIC
Glucose, UA: NEGATIVE mg/dL
Ketones, ur: NEGATIVE mg/dL
Nitrite: NEGATIVE
Protein, ur: NEGATIVE mg/dL
pH: 7 (ref 5.0–8.0)

## 2013-07-19 LAB — CBC
HCT: 38.4 % (ref 36.0–46.0)
MCH: 32.6 pg (ref 26.0–34.0)
MCV: 99.5 fL (ref 78.0–100.0)
Platelets: 161 10*3/uL (ref 150–400)
RDW: 14.2 % (ref 11.5–15.5)

## 2013-07-19 LAB — LIPASE, BLOOD: Lipase: 19 U/L (ref 11–59)

## 2013-07-19 LAB — URINE MICROSCOPIC-ADD ON

## 2013-07-19 MED ORDER — ONDANSETRON HCL 4 MG/2ML IJ SOLN
4.0000 mg | Freq: Once | INTRAMUSCULAR | Status: DC
  Filled 2013-07-19: qty 2

## 2013-07-19 MED ORDER — SODIUM CHLORIDE 0.9 % IV BOLUS (SEPSIS)
1000.0000 mL | Freq: Once | INTRAVENOUS | Status: AC
  Administered 2013-07-19: 1000 mL via INTRAVENOUS

## 2013-07-19 NOTE — ED Notes (Signed)
Pt from home with c/o of nausea after she eats and diarrhea x2 days. Hx C-Diff last year. Denies vomiting. C/o of weakness and lethargy.

## 2013-07-19 NOTE — ED Provider Notes (Signed)
CSN: [id number]     Arrival date & time 07/19/13  1021 History     First MD Initiated Contact with Patient 07/19/13 1043     No chief complaint on file.  (Consider location/radiation/quality/duration/timing/severity/associated sxs/prior Treatment) HPI   79 yo F presents with worsening diarrhea and nausea x 3 days. Patients family states she had an episode of diarrhea last night and at 6am this morning she was unable to leave the bathroom due to frequent diarrhea episodes. She states her stools are watery. She denies hematochezia and melena. She has a long history of recurrent diarrhea which she states is due to antibiotic use. She has been feeling nauseated for a couple weeks and had an episode of vomiting this morning. She denies hematemesis. She was given zofran by her PCP which she has been taking. She reports a history of C. difficile colitis within the past year. Family states she cannot have any antibiotics as they will cause diarrhea. She denies fever, abdominal pain, chest pain, SOB, and dysuria. She has had a decreased appetite and notes a dry mout Past Medical History  Diagnosis Date  . NSTEMI (non-ST elevated myocardial infarction) 2009    Treated medically, never cathed, EF 45%  . HLD (hyperlipidemia)   . HTN (hypertension)   . Peripheral vascular disease, unspecified     eval by [facility] in the past  . Anemia   . Chronic pancreatitis   . Type II or unspecified type diabetes mellitus without mention of complication, not stated as uncontrolled   . Hypothyroidism   . Osteoporosis, unspecified   . Calculus of kidney   . Atrial fibrillation     admx 05/2013 with AF with RVR;  started on amiodarone Rx;  CHADS2-VASc=6 (Eliquis 2.5 bid started)   . Squamous cell carcinoma in situ of skin of lower leg, right   . Hx of echocardiogram     a. Echocardiogram 04/21/13:  Moderate LVH, EF 60%, diastolic dysfunction, aortic sclerosis without stenosis, MAC, mild MR, mild LAE, mildly reduced RVSF,  mild RAE, PASP 43   Past Surgical History  Procedure Laterality Date  . Total hip arthroplasty    . Appendectomy    . Abdominal hysterectomy     Family History  Problem Relation Age of Onset  . Coronary artery disease    . Diabetes     History  Substance Use Topics  . Smoking status: Never Smoker   . Smokeless tobacco: Never Used  . Alcohol Use: No   OB History   Grav Para Term Preterm Abortions TAB SAB Ect Mult Living                 Review of Systems Ten systems reviewed and are negative for acute change, except as noted in the HPI.   Allergies  Ciprofloxacin; Codeine; Penicillins; and Sulfamethoxazole w-trimethoprim  Home Medications   Current Outpatient Rx  Name  Route  Sig  Dispense  Refill  . ALPRAZolam (XANAX) 0.5 MG tablet   Oral   Take 0.5 mg by mouth at bedtime as needed for sleep.         Marland Kitchen amiodarone (PACERONE) 200 MG tablet   Oral   Take 1 tablet (200 mg total) by mouth daily.   30 tablet   0   . apixaban (ELIQUIS) 2.5 MG TABS tablet   Oral   Take 1 tablet (2.5 mg total) by mouth 2 (two) times daily.   60 tablet   0   .  diltiazem (CARDIZEM CD) 180 MG 24 hr capsule   Oral   Take 1 capsule (180 mg total) by mouth daily.   90 capsule   3   . diltiazem (CARDIZEM) 30 MG tablet   Oral   Take 1 tablet (30 mg total) by mouth every 6 (six) hours as needed (increased HR/ palpitations).   90 tablet   0   . furosemide (LASIX) 20 MG tablet   Oral   Take 1 tablet (20 mg total) by mouth daily.   30 tablet   0   . gemfibrozil (LOPID) 600 MG tablet   Oral   Take 600 mg by mouth daily.          . insulin glargine (LANTUS) 100 UNIT/ML injection   Subcutaneous   Inject 14 Units into the skin daily.          . isosorbide mononitrate (IMDUR) 30 MG 24 hr tablet   Oral   Take 30 mg by mouth 2 (two) times daily.         Marland Kitchen levothyroxine (SYNTHROID, LEVOTHROID) 112 MCG tablet   Oral   Take 112 mcg by mouth daily.           . metFORMIN  (GLUCOPHAGE) 1000 MG tablet   Oral   Take 500 mg by mouth 2 (two) times daily with a meal.          . nitroGLYCERIN (NITROSTAT) 0.4 MG SL tablet   Sublingual   Place 0.4 mg under the tongue every 5 (five) minutes as needed for chest pain.         . sitaGLIPtin (JANUVIA) 50 MG tablet   Oral   Take 25 mg by mouth daily.           BP 151/50  Pulse 86  Temp(Src) 98.1 F (36.7 C) (Oral)  Resp 18  SpO2 96% Physical Exam Physical Exam  Nursing note and vitals reviewed. Constitutional: She is oriented to person, place, and time. Elderly female in NAD. HENT:  Head: Normocephalic and atraumatic.  Dry mucous membranes. Eyes: Conjunctivae normal and EOM are normal. Pupils are equal, round, and reactive to light. No scleral icterus.  Neck: Normal range of motion.  Cardiovascular: Normal rate, regular rhythm and normal heart sounds.  Exam reveals no gallop and no friction rub.   No murmur heard. Pulmonary/Chest: Effort normal and breath sounds normal. No respiratory distress.  Abdominal: Soft. Bowel sounds are normal. She exhibits no distension and no mass. There is no tenderness. There is no guarding.  Neurological: She is alert and oriented to person, place, and time.  Skin: Skin is warm and dry. She is not diaphoretic.    ED Course   Procedures (including critical care time)  Labs Reviewed - No data to display No results found. 1. Nausea & vomiting   2. Diarrhea   3. Dehydration   4. UTI (lower urinary tract infection)   5. Leukocytosis     MDM  1:01 PM Patient with UTI. Hyponatremia. Her family member states taht she cannot take any antibitoics. I have spoken with Dr. Norval Gable nurse (dr. Darcus Austin is out of the office) He will return my call to discuss the case.   3:08 PM I have spoken with Dr. Jarold Motto. Pending normal orthostatics I will discharge the patient home with PCP follow up in regards to her urine culture.   3:12 PM Patient with orthostatics  negative. She will be discharged to home and follow up with her  PCP. Her family expresses understanding and agrees with plan of care.   Arthor Captain, PA-C 07/22/13 2208

## 2013-07-22 LAB — URINE CULTURE

## 2013-07-23 NOTE — Progress Notes (Signed)
Post ED Visit - Positive Culture Follow-up  Culture report reviewed by antimicrobial stewardship pharmacist: []  Wes Dulaney, Pharm.D., BCPS []  Celedonio Miyamoto, Pharm.D., BCPS []  Georgina Pillion, Pharm.D., BCPS []  [place], [address].D., BCPS, AAHIVP [x]  Estella Husk, Pharm.D., BCPS, AAHIVP  Positive Urine culture Colony count is below threshold for treatment and no further patient follow-up is required at this time.  Sallee Provencal 07/23/2013, 2:27 PM

## 2013-07-23 NOTE — ED Provider Notes (Signed)
History/physical exam/procedure(s) were performed by non-physician practitioner and as supervising physician I was immediately available for consultation/collaboration. I have reviewed all notes and am in agreement with care and plan.   Jaise Moser S Cannen Dupras, MD 07/23/13 1504 

## 2013-09-25 ENCOUNTER — Telehealth: Payer: Self-pay | Admitting: Cardiology

## 2013-09-25 DIAGNOSIS — I4891 Unspecified atrial fibrillation: Secondary | ICD-10-CM

## 2013-09-25 DIAGNOSIS — I5022 Chronic systolic (congestive) heart failure: Secondary | ICD-10-CM

## 2013-09-25 DIAGNOSIS — I5032 Chronic diastolic (congestive) heart failure: Secondary | ICD-10-CM

## 2013-09-25 NOTE — Telephone Encounter (Signed)
Pt has an  appt with Dr Shirlee Latch 10/09/13. I will forward to Dr Shirlee Latch for review.

## 2013-09-25 NOTE — Telephone Encounter (Signed)
Dennie Bible states pt's weight 10/11 97lbs 10/12 94 lbs 10/13 98.4 lbs then 102.6 lbs about 4 hours later after eating breakfast. She states pt's usual weight is 93-95 lbs. Pt had dry cough last night, but no other new symptoms. Pt normally takes lasix 10mg  every other day, she did give pt lasix 20mg  today. Dennie Bible is asking for further recommendations.

## 2013-09-25 NOTE — Telephone Encounter (Signed)
New Problem  Private care nurse called and states that the Pt has gained 7 lbs since 10/12. Please call back

## 2013-09-25 NOTE — Telephone Encounter (Signed)
[name] advised, verbalized understanding.

## 2013-09-25 NOTE — Telephone Encounter (Signed)
Until appointment, take lasix 10 mg daily instead of every other day and get BMET/BNP prior to her appointment with me.

## 2013-10-05 ENCOUNTER — Other Ambulatory Visit: Payer: Medicare Other

## 2013-10-09 ENCOUNTER — Ambulatory Visit: Payer: Medicare Other | Admitting: Cardiology

## 2013-10-09 ENCOUNTER — Telehealth: Payer: Self-pay | Admitting: Cardiology

## 2013-10-09 NOTE — Telephone Encounter (Signed)
New problem    Do you know the pt is on hospice and does she need a blood draw. Please advise. '

## 2013-10-09 NOTE — Telephone Encounter (Signed)
Spoke with [name] from [facility], she will draw BMET/BNP and fax results to Dr Shirlee Latch. Pt cancelled appt today because it is hard for her to get out and she is feeling fine.

## 2013-10-13 ENCOUNTER — Encounter: Payer: Self-pay | Admitting: Cardiology

## 2013-10-16 ENCOUNTER — Encounter: Payer: Self-pay | Admitting: Cardiology

## 2013-12-11 ENCOUNTER — Ambulatory Visit: Payer: Medicare Other | Admitting: Cardiology

## 2014-01-16 ENCOUNTER — Telehealth: Payer: Self-pay | Admitting: *Deleted

## 2014-01-16 NOTE — Telephone Encounter (Signed)
Pt's dtr, Asencion Partridge states pt was given a blue boot to protect pt's foot, but isn't completely taking care of the diabetic ulcer.  I reviewed our records, pt was seen prior to 09/13/2014.  I offered an appt, Asencion Partridge states pt cannot walk and sits mostly, crossing her legs which causes the pressure on the foot.  I instructed Asencion Partridge to place a pillow between the feet and possible also hang over the edge of other pillows.  Asencion Partridge states the area is not open, but is like a deep bruise.  i told Asencion Partridge not to wait until the area opened, to make an appt soon.  Asencion Partridge stated she would if necessary.

## 2014-03-14 DEATH — deceased

## 2014-11-22 ENCOUNTER — Encounter (HOSPITAL_COMMUNITY): Payer: Self-pay | Admitting: Vascular Surgery

## 2015-10-28 ENCOUNTER — Encounter: Payer: Self-pay | Admitting: Cardiology
